# Patient Record
Sex: Female | Born: 1935
Health system: Southern US, Community
[De-identification: ages and names within clinical notes are randomized; demographics above are authoritative.]

## PROBLEM LIST (undated history)

## (undated) DIAGNOSIS — M8580 Other specified disorders of bone density and structure, unspecified site: Secondary | ICD-10-CM

## (undated) DIAGNOSIS — T7840XA Allergy, unspecified, initial encounter: Secondary | ICD-10-CM

## (undated) DIAGNOSIS — C801 Malignant (primary) neoplasm, unspecified: Secondary | ICD-10-CM

## (undated) DIAGNOSIS — H269 Unspecified cataract: Secondary | ICD-10-CM

## (undated) DIAGNOSIS — R42 Dizziness and giddiness: Secondary | ICD-10-CM

## (undated) DIAGNOSIS — E785 Hyperlipidemia, unspecified: Secondary | ICD-10-CM

## (undated) DIAGNOSIS — I10 Essential (primary) hypertension: Secondary | ICD-10-CM

## (undated) DIAGNOSIS — H409 Unspecified glaucoma: Secondary | ICD-10-CM

## (undated) HISTORY — DX: Unspecified cataract: H26.9

## (undated) HISTORY — PX: NM PET DX LYMPHOMA: HXRAD65

## (undated) HISTORY — DX: Allergy, unspecified, initial encounter: T78.40XA

## (undated) HISTORY — DX: Other specified disorders of bone density and structure, unspecified site: M85.80

## (undated) HISTORY — DX: Hyperlipidemia, unspecified: E78.5

## (undated) HISTORY — PX: ACNE CYST REMOVAL: SUR1112

## (undated) HISTORY — DX: Unspecified glaucoma: H40.9

---

## 1977-10-25 HISTORY — PX: ABDOMINAL HYSTERECTOMY: SHX81

## 2002-06-14 ENCOUNTER — Ambulatory Visit (HOSPITAL_COMMUNITY): Admission: RE | Admit: 2002-06-14 | Discharge: 2002-06-14 | Payer: Self-pay | Admitting: Family Medicine

## 2002-06-14 ENCOUNTER — Encounter: Payer: Self-pay | Admitting: Family Medicine

## 2002-08-02 ENCOUNTER — Ambulatory Visit (HOSPITAL_BASED_OUTPATIENT_CLINIC_OR_DEPARTMENT_OTHER): Admission: RE | Admit: 2002-08-02 | Discharge: 2002-08-02 | Payer: Self-pay | Admitting: Surgery

## 2002-08-02 ENCOUNTER — Encounter (INDEPENDENT_AMBULATORY_CARE_PROVIDER_SITE_OTHER): Payer: Self-pay | Admitting: *Deleted

## 2004-10-25 HISTORY — PX: OTHER SURGICAL HISTORY: SHX169

## 2005-07-29 ENCOUNTER — Other Ambulatory Visit: Admission: RE | Admit: 2005-07-29 | Discharge: 2005-07-29 | Payer: Self-pay | Admitting: Family Medicine

## 2007-02-27 ENCOUNTER — Ambulatory Visit (HOSPITAL_COMMUNITY): Admission: RE | Admit: 2007-02-27 | Discharge: 2007-02-27 | Payer: Self-pay | Admitting: Ophthalmology

## 2008-01-26 ENCOUNTER — Ambulatory Visit (HOSPITAL_COMMUNITY): Admission: RE | Admit: 2008-01-26 | Discharge: 2008-01-26 | Payer: Self-pay | Admitting: Family Medicine

## 2008-12-04 ENCOUNTER — Encounter (INDEPENDENT_AMBULATORY_CARE_PROVIDER_SITE_OTHER): Payer: Self-pay | Admitting: *Deleted

## 2009-04-02 ENCOUNTER — Ambulatory Visit: Payer: Self-pay | Admitting: Gastroenterology

## 2009-04-16 ENCOUNTER — Ambulatory Visit: Payer: Self-pay | Admitting: Gastroenterology

## 2009-10-25 DIAGNOSIS — R42 Dizziness and giddiness: Secondary | ICD-10-CM

## 2009-10-25 HISTORY — DX: Dizziness and giddiness: R42

## 2011-03-12 NOTE — Op Note (Signed)
   NAME:  Maria Camacho, Maria Camacho                           ACCOUNT NO.:  [id number]   MEDICAL RECORD NO.:  [id number]                   PATIENT TYPE:  AMB   LOCATION:  [facility]                                  FACILITY:  [facility]   PHYSICIAN:  Abigail Miyamoto, MD                DATE OF BIRTH:  [birth year]   DATE OF PROCEDURE:  08/02/2002  DATE OF DISCHARGE:                                 OPERATIVE REPORT   PREOPERATIVE DIAGNOSES:  2 cm midline neck mass.   POSTOPERATIVE DIAGNOSES:  2 cm midline neck mass.   PROCEDURE:  Excision of midline neck mass.   SURGEON:  Abigail Miyamoto, M.D.   ANESTHESIA:  1% lidocaine and monitored anesthesia care.   ESTIMATED BLOOD LOSS:  Minimal.   FINDINGS:  The patient was found to have a small, prominent area of fatty  tissue just above the sternal notch.  No other pathology was identified in  the neck.   PROCEDURE IN DETAIL:  The patient was brought to the operating room and  identified as [name].  She was placed supine on the operating room  table, and anesthesia was induced. Her neck was then prepped and draped in  the usual sterile fashion.  The skin overlying the palpable abnormality was  then anesthetized with 1% lidocaine.  A small transverse incision was then  made across the lower neck just above the sternal notch.  The incision was  carried down through the platysma via electrocautery.  The patient was found  to have prominent fatty-appearing tissue in this area, and this was grasped  with an Allis clamp and completely excised circumferentially with  electrocautery.  The sternocleidomastoid muscles were then easily  identified, as well the prominent heads of the clavicle.  The trachea also  was examined and was normal, and no other pathology or masses were palpated  in the neck area through this incision.  Hemostasis was then achieved with  the cautery.  The platysma was then reapproximated with interrupted 3-0  Vicryl sutures, and the skin was  closed with running 4-0 Monocryl.  Steri-  Strips were applied.  The patient tolerated the procedure well.  All counts  were correct at the end of the procedure.  The patient was then taken in a  stable condition from the operating room to the recovery room.                                               Abigail Miyamoto, MD    DB/MEDQ  D:  08/02/2002  T:  08/02/2002  Job:  [id number]

## 2012-02-22 DIAGNOSIS — R413 Other amnesia: Secondary | ICD-10-CM | POA: Insufficient documentation

## 2012-08-02 ENCOUNTER — Encounter (HOSPITAL_COMMUNITY): Payer: Self-pay | Admitting: Pharmacy Technician

## 2012-08-05 ENCOUNTER — Other Ambulatory Visit: Payer: Self-pay | Admitting: Specialist

## 2012-08-08 ENCOUNTER — Ambulatory Visit (HOSPITAL_COMMUNITY)
Admission: RE | Admit: 2012-08-08 | Discharge: 2012-08-08 | Disposition: A | Discharge status: Home / Self Care | Payer: Medicare Other | Source: Ambulatory Visit | Attending: Specialist | Admitting: Specialist

## 2012-08-08 ENCOUNTER — Encounter (HOSPITAL_COMMUNITY)
Admission: RE | Admit: 2012-08-08 | Discharge: 2012-08-08 | Disposition: A | Discharge status: Home / Self Care | Payer: Medicare Other | Source: Ambulatory Visit | Attending: Specialist | Admitting: Specialist

## 2012-08-08 ENCOUNTER — Other Ambulatory Visit: Payer: Self-pay

## 2012-08-08 ENCOUNTER — Encounter (HOSPITAL_COMMUNITY): Payer: Self-pay

## 2012-08-08 DIAGNOSIS — Z01810 Encounter for preprocedural cardiovascular examination: Secondary | ICD-10-CM | POA: Insufficient documentation

## 2012-08-08 DIAGNOSIS — Z01818 Encounter for other preprocedural examination: Secondary | ICD-10-CM | POA: Insufficient documentation

## 2012-08-08 DIAGNOSIS — Z01812 Encounter for preprocedural laboratory examination: Secondary | ICD-10-CM | POA: Insufficient documentation

## 2012-08-08 HISTORY — DX: Dizziness and giddiness: R42

## 2012-08-08 HISTORY — DX: Malignant (primary) neoplasm, unspecified: C80.1

## 2012-08-08 HISTORY — DX: Essential (primary) hypertension: I10

## 2012-08-08 NOTE — Patient Instructions (Addendum)
20 Maria Camacho  08/08/2012   Your procedure is scheduled on:  Thursday 08-10-2012  Report to [facility] at  AM. 0930  Call this number if you have problems the morning of surgery: [phone]   Remember:   Do not eat food or drink :After Midnight.      Take these medicines the morning of surgery with A SIP OF WATER: None   Do not wear jewelry, make-up or nail polish.  Do not wear lotions, powders, or perfumes. You may wear deodorant.  Do not shave 48 hours prior to surgery. Men may shave face and neck.  Do not bring valuables to the hospital.  Contacts, dentures or bridgework may not be worn into surgery.  Leave suitcase in the car. After surgery it may be brought to your room.  For patients admitted to the hospital, checkout time is 11:00 AM the day of discharge.   Patients discharged the day of surgery will not be allowed to drive home.  Name and phone number of your driver:   Special Instructions: CHG Shower Use Special Wash: 1/2 bottle night before surgery and 1/2 bottle morning of surgery.   Please read over the following fact sheets that you were given: MRSA Information,deep breath and cough post surgery every other hour

## 2012-08-10 ENCOUNTER — Encounter (HOSPITAL_COMMUNITY): Payer: Self-pay | Admitting: *Deleted

## 2012-08-10 ENCOUNTER — Ambulatory Visit (HOSPITAL_COMMUNITY): Payer: Medicare Other | Admitting: Anesthesiology

## 2012-08-10 ENCOUNTER — Encounter (HOSPITAL_COMMUNITY)
Admission: RE | Disposition: A | Discharge status: Home / Self Care | Payer: Self-pay | Source: Ambulatory Visit | Attending: Specialist

## 2012-08-10 ENCOUNTER — Encounter (HOSPITAL_COMMUNITY): Payer: Self-pay | Admitting: Anesthesiology

## 2012-08-10 ENCOUNTER — Ambulatory Visit (HOSPITAL_COMMUNITY)
Admission: RE | Admit: 2012-08-10 | Discharge: 2012-08-10 | Disposition: A | Discharge status: Home / Self Care | Payer: Medicare Other | Source: Ambulatory Visit | Attending: Specialist | Admitting: Specialist

## 2012-08-10 DIAGNOSIS — I10 Essential (primary) hypertension: Secondary | ICD-10-CM | POA: Insufficient documentation

## 2012-08-10 DIAGNOSIS — M2240 Chondromalacia patellae, unspecified knee: Secondary | ICD-10-CM | POA: Insufficient documentation

## 2012-08-10 DIAGNOSIS — M9420 Chondromalacia, unspecified site: Secondary | ICD-10-CM | POA: Insufficient documentation

## 2012-08-10 DIAGNOSIS — M2340 Loose body in knee, unspecified knee: Secondary | ICD-10-CM | POA: Insufficient documentation

## 2012-08-10 DIAGNOSIS — X58XXXA Exposure to other specified factors, initial encounter: Secondary | ICD-10-CM | POA: Insufficient documentation

## 2012-08-10 DIAGNOSIS — S83206A Unspecified tear of unspecified meniscus, current injury, right knee, initial encounter: Secondary | ICD-10-CM

## 2012-08-10 DIAGNOSIS — IMO0002 Reserved for concepts with insufficient information to code with codable children: Secondary | ICD-10-CM | POA: Insufficient documentation

## 2012-08-10 DIAGNOSIS — Z79899 Other long term (current) drug therapy: Secondary | ICD-10-CM | POA: Insufficient documentation

## 2012-08-10 HISTORY — PX: KNEE ARTHROSCOPY: SHX127

## 2012-08-10 SURGERY — ARTHROSCOPY, KNEE
Anesthesia: General | Site: Knee | Laterality: Left | Wound class: Clean

## 2012-08-10 MED ORDER — BUPIVACAINE-EPINEPHRINE (PF) 0.5% -1:200000 IJ SOLN
INTRAMUSCULAR | Status: AC
  Filled 2012-08-10: qty 10

## 2012-08-10 MED ORDER — CHLORHEXIDINE GLUCONATE 4 % EX LIQD
60.0000 mL | Freq: Once | CUTANEOUS | Status: DC
  Filled 2012-08-10: qty 60

## 2012-08-10 MED ORDER — LACTATED RINGERS IV SOLN: INTRAVENOUS | Status: DC

## 2012-08-10 MED ORDER — CEFAZOLIN SODIUM-DEXTROSE 2-3 GM-% IV SOLR: 2.0000 g | INTRAVENOUS | Status: DC

## 2012-08-10 MED ORDER — ACETAMINOPHEN 10 MG/ML IV SOLN
INTRAVENOUS | Status: AC
  Filled 2012-08-10: qty 100

## 2012-08-10 MED ORDER — HYDROCODONE-ACETAMINOPHEN 5-325 MG PO TABS: 1.0000 | ORAL_TABLET | ORAL | Status: DC | PRN

## 2012-08-10 MED ORDER — EPINEPHRINE HCL 1 MG/ML IJ SOLN
INTRAMUSCULAR | Status: DC | PRN
  Administered 2012-08-10 (×2): 1 mg

## 2012-08-10 MED ORDER — ACETAMINOPHEN 10 MG/ML IV SOLN
INTRAVENOUS | Status: DC | PRN
  Administered 2012-08-10: 1000 mg via INTRAVENOUS

## 2012-08-10 MED ORDER — LACTATED RINGERS IR SOLN
Status: DC | PRN
  Administered 2012-08-10 (×2): 3000 mL

## 2012-08-10 MED ORDER — LACTATED RINGERS IV SOLN
INTRAVENOUS | Status: DC
  Administered 2012-08-10: 1000 mL via INTRAVENOUS

## 2012-08-10 MED ORDER — VANCOMYCIN HCL IN DEXTROSE 1-5 GM/200ML-% IV SOLN
INTRAVENOUS | Status: AC
  Filled 2012-08-10: qty 200

## 2012-08-10 MED ORDER — PROMETHAZINE HCL 25 MG/ML IJ SOLN: 6.2500 mg | INTRAMUSCULAR | Status: DC | PRN

## 2012-08-10 MED ORDER — EPINEPHRINE HCL 1 MG/ML IJ SOLN
INTRAMUSCULAR | Status: AC
  Filled 2012-08-10: qty 2

## 2012-08-10 MED ORDER — PROPOFOL 10 MG/ML IV BOLUS
INTRAVENOUS | Status: DC | PRN
  Administered 2012-08-10: 150 mg via INTRAVENOUS

## 2012-08-10 MED ORDER — MEPERIDINE HCL 50 MG/ML IJ SOLN: 6.2500 mg | INTRAMUSCULAR | Status: DC | PRN

## 2012-08-10 MED ORDER — VANCOMYCIN HCL IN DEXTROSE 1-5 GM/200ML-% IV SOLN
1000.0000 mg | INTRAVENOUS | Status: AC
  Administered 2012-08-10: 1000 mg via INTRAVENOUS

## 2012-08-10 MED ORDER — AMLODIPINE BESYLATE 5 MG PO TABS
5.0000 mg | ORAL_TABLET | ORAL | Status: AC
  Administered 2012-08-10: 5 mg via ORAL
  Filled 2012-08-10: qty 1

## 2012-08-10 MED ORDER — ONDANSETRON HCL 4 MG/2ML IJ SOLN
INTRAMUSCULAR | Status: DC | PRN
  Administered 2012-08-10: 4 mg via INTRAVENOUS

## 2012-08-10 MED ORDER — LIDOCAINE HCL (CARDIAC) 20 MG/ML IV SOLN
INTRAVENOUS | Status: DC | PRN
  Administered 2012-08-10: 50 mg via INTRAVENOUS

## 2012-08-10 MED ORDER — BUPIVACAINE-EPINEPHRINE 0.5% -1:200000 IJ SOLN
INTRAMUSCULAR | Status: DC | PRN
  Administered 2012-08-10: 20 mL

## 2012-08-10 MED ORDER — FENTANYL CITRATE 0.05 MG/ML IJ SOLN
INTRAMUSCULAR | Status: DC | PRN
  Administered 2012-08-10 (×2): 50 ug via INTRAVENOUS

## 2012-08-10 MED ORDER — FENTANYL CITRATE 0.05 MG/ML IJ SOLN: 25.0000 ug | INTRAMUSCULAR | Status: DC | PRN

## 2012-08-10 SURGICAL SUPPLY — 22 items
BANDAGE ELASTIC 6 VELCRO ST LF (GAUZE/BANDAGES/DRESSINGS) ×2 IMPLANT
BLADE 4.2CUDA (BLADE) ×2 IMPLANT
BLADE CUDA SHAVER 3.5 (BLADE) ×2 IMPLANT
CLOTH BEACON ORANGE TIMEOUT ST (SAFETY) ×2 IMPLANT
DRSG EMULSION OIL 3X3 NADH (GAUZE/BANDAGES/DRESSINGS) ×2 IMPLANT
DURAPREP 26ML APPLICATOR (WOUND CARE) ×4 IMPLANT
GLOVE BIOGEL PI IND STRL 6.5 (GLOVE) ×1 IMPLANT
GLOVE BIOGEL PI IND STRL 8 (GLOVE) IMPLANT
GLOVE BIOGEL PI INDICATOR 6.5 (GLOVE) ×1
GLOVE BIOGEL PI INDICATOR 8 (GLOVE)
GLOVE ECLIPSE 6.5 STRL STRAW (GLOVE) ×2 IMPLANT
GLOVE SURG SS PI 8.0 STRL IVOR (GLOVE) ×4 IMPLANT
GOWN PREVENTION PLUS LG XLONG (DISPOSABLE) ×2 IMPLANT
GOWN STRL REIN XL XLG (GOWN DISPOSABLE) ×2 IMPLANT
MANIFOLD NEPTUNE II (INSTRUMENTS) ×2 IMPLANT
PACK ARTHROSCOPY WL (CUSTOM PROCEDURE TRAY) ×2 IMPLANT
PADDING CAST COTTON 6X4 STRL (CAST SUPPLIES) ×2 IMPLANT
SET ARTHROSCOPY TUBING (MISCELLANEOUS) ×1
SET ARTHROSCOPY TUBING LN (MISCELLANEOUS) ×1 IMPLANT
SUT ETHILON 4 0 PS 2 18 (SUTURE) ×2 IMPLANT
TOWEL OR NON WOVEN STRL DISP B (DISPOSABLE) ×2 IMPLANT
WRAP KNEE MAXI GEL POST OP (GAUZE/BANDAGES/DRESSINGS) ×2 IMPLANT

## 2012-08-10 NOTE — Transfer of Care (Signed)
Immediate Anesthesia Transfer of Care Note  Patient: Maria Camacho  Procedure(s) Performed: Procedure(s) (LRB) with comments: ARTHROSCOPY KNEE (Left) - WITH DEBRIDEMENT  Patient Location: PACU  Anesthesia Type: General  Level of Consciousness: awake, alert  and patient cooperative  Airway & Oxygen Therapy: Patient Spontanous Breathing and Patient connected to face mask oxygen  Post-op Assessment: Report given to PACU RN, Post -op Vital signs reviewed and stable and Patient moving all extremities X 4  Post vital signs: Reviewed and stable  Complications: No apparent anesthesia complications

## 2012-08-10 NOTE — H&P (Signed)
Maria Camacho is an 78 y.o. female.   Chief Complaint: left knee pain HPI: Meniscus tear DJD left knee.  Past Medical History  Diagnosis Date  . Hypertension   . Cancer     kidney right  . Vertigo 2011    Past Surgical History  Procedure Date  . Nm pet dx lymphoma   . Acne cyst removal over 20 yrs. ago    Fatty tiisue of neck  . Abdominal hysterectomy 1979    History reviewed. No pertinent family history. Social History:  reports that she has never smoked. She has never used smokeless tobacco. She reports that she does not drink alcohol or use illicit drugs.  Allergies:  Allergies  Allergen Reactions  . Penicillins     REACTION: hives    Medications Prior to Admission  Medication Sig Dispense Refill  . amLODipine-valsartan (EXFORGE) 10-320 MG per tablet Take 0.5 tablets by mouth every morning.      . B Complex-C-E-Zn (BEC/ZINC) TABS Take 1 tablet by mouth daily.      . Cholecalciferol (VITAMIN D) 2000 UNITS tablet Take 2,000 Units by mouth daily.      . fish oil-omega-3 fatty acids 1000 MG capsule Take 1 g by mouth daily.      Marland Kitchen ibuprofen (ADVIL,MOTRIN) 200 MG tablet Take 200 mg by mouth every 6 (six) hours as needed. For pain        No results found for this or any previous visit (from the past 48 hour(s)). No results found.  Review of Systems  Musculoskeletal: Positive for joint pain.  All other systems reviewed and are negative.    Blood pressure 147/78, pulse 62, temperature 97.6 F (36.4 C), resp. rate 20, SpO2 100.00%. Physical Exam  Vitals reviewed. Constitutional: She is oriented to person, place, and time. She appears well-developed.  HENT:  Head: Normocephalic.  Eyes: Pupils are equal, round, and reactive to light.  Neck: Normal range of motion.  Cardiovascular: Normal rate.   Respiratory: Effort normal.  GI: Soft.  Musculoskeletal:       MJLT left knee. +Mcmurray left. +effusion.  Neurological: She is alert and oriented to person, place, and  time.  Skin: Skin is warm and dry.  Psychiatric: She has a normal mood and affect.   MRI DJD meniscus tear.  Assessment/Plan Meniscus tear, DJD left knee. Plan Arthroscopy. Risks discussed.  Remington Highbaugh C 08/10/2012, 12:01 PM

## 2012-08-10 NOTE — Brief Op Note (Signed)
08/10/2012  1:24 PM  PATIENT:  Maria Camacho  78 y.o. female  PRE-OPERATIVE DIAGNOSIS:  MEDIAL MENISCAL TEAR  POST-OPERATIVE DIAGNOSIS:  MEDIAL MENISCAL TEAR  PROCEDURE:  Procedure(s) (LRB) with comments: ARTHROSCOPY KNEE (Left) - WITH DEBRIDEMENT  SURGEON:  Surgeon(s) and Role:    * Javier Docker, MD - Primary  PHYSICIAN ASSISTANT:   ASSISTANTS: none   ANESTHESIA:   general  EBL:     BLOOD ADMINISTERED:none  DRAINS: none   LOCAL MEDICATIONS USED:  MARCAINE     SPECIMEN:  No Specimen  DISPOSITION OF SPECIMEN:  N/A  COUNTS:  YES  TOURNIQUET:  * No tourniquets in log *  DICTATION: .Other Dictation: Dictation Number [id number]  PLAN OF CARE: Discharge to home after PACU  PATIENT DISPOSITION:  PACU - hemodynamically stable.   Delay start of Pharmacological VTE agent (>24hrs) due to surgical blood loss or risk of bleeding: no

## 2012-08-10 NOTE — Anesthesia Preprocedure Evaluation (Signed)
Anesthesia Evaluation  Patient identified by MRN, date of birth, ID band Patient awake    Reviewed: Allergy & Precautions, H&P , NPO status , Patient's Chart, lab work & pertinent test results  Airway Mallampati: II TM Distance: >3 FB Neck ROM: Full    Dental No notable dental hx.    Pulmonary neg pulmonary ROS,  breath sounds clear to auscultation  Pulmonary exam normal       Cardiovascular hypertension, Pt. on medications negative cardio ROS  Rhythm:Regular Rate:Normal     Neuro/Psych negative neurological ROS  negative psych ROS   GI/Hepatic negative GI ROS, Neg liver ROS,   Endo/Other  negative endocrine ROS  Renal/GU negative Renal ROS  negative genitourinary   Musculoskeletal negative musculoskeletal ROS (+)   Abdominal   Peds negative pediatric ROS (+)  Hematology negative hematology ROS (+)   Anesthesia Other Findings   Reproductive/Obstetrics negative OB ROS                           Anesthesia Physical Anesthesia Plan  ASA: II  Anesthesia Plan: General   Post-op Pain Management:    Induction: Intravenous  Airway Management Planned: LMA  Additional Equipment:   Intra-op Plan:   Post-operative Plan: Extubation in OR  Informed Consent: I have reviewed the patients History and Physical, chart, labs and discussed the procedure including the risks, benefits and alternatives for the proposed anesthesia with the patient or authorized representative who has indicated his/her understanding and acceptance.   Dental advisory given  Plan Discussed with: CRNA  Anesthesia Plan Comments:         Anesthesia Quick Evaluation  

## 2012-08-10 NOTE — Progress Notes (Signed)
Pt has walker at home. Ambulated to bathroom with loaner walker s/p left knee arthroscopy and tolerated well.

## 2012-08-10 NOTE — Anesthesia Postprocedure Evaluation (Signed)
  Anesthesia Post-op Note  Patient: Maria Camacho  Procedure(s) Performed: Procedure(s) (LRB): ARTHROSCOPY KNEE (Left)  Patient Location: PACU  Anesthesia Type: General  Level of Consciousness: awake and alert   Airway and Oxygen Therapy: Patient Spontanous Breathing  Post-op Pain: mild  Post-op Assessment: Post-op Vital signs reviewed, Patient's Cardiovascular Status Stable, Respiratory Function Stable, Patent Airway and No signs of Nausea or vomiting  Post-op Vital Signs: stable  Complications: No apparent anesthesia complications

## 2012-08-11 ENCOUNTER — Encounter (HOSPITAL_COMMUNITY): Payer: Self-pay | Admitting: Specialist

## 2012-08-11 NOTE — Op Note (Signed)
Maria Camacho, Maria Camacho                 ACCOUNT NO.:  [id number]  MEDICAL RECORD NO.:  [id number]  LOCATION:  [facility]                         FACILITY:  [facility]  PHYSICIAN:  Jene Every, M.D.    DATE OF BIRTH:  18-Nov-1932  DATE OF PROCEDURE:  08/10/2012 DATE OF DISCHARGE:  08/10/2012                              OPERATIVE REPORT   PREOPERATIVE DIAGNOSIS:  Medial meniscal tear of the left knee.  POSTOPERATIVE DIAGNOSES:  Medial meniscal tear of the left knee, chondromalacia of patellofemoral joint, lateral compartment, medial compartment with loose cartilaginous bodies.  PROCEDURE PERFORMED: 1. Left knee arthroscopy. 2. Partial medial meniscectomy. 3. Removal of loose bodies. 4. Chondroplasty of medial femoral condyle and of the patella.  ANESTHESIA:  General.  ASSISTANT:  None.  HISTORY:  A 78 year old with locking and popping giving way.  MRI indicating displaced meniscal tear posteromedially, posterior third, indicated for partial meniscectomy and debridement.  Risks and benefits were discussed including bleeding, infection, DVT, PE, and anesthetic complications, etc.  TECHNIQUE:  With the patient in supine position after induction of adequate general anesthesia, 1 g of vancomycin, the left lower extremity was prepped and draped in usual sterile fashion.  A lateral parapatellar portal was fashioned with a #11 blade.  Ingress cannula was atraumatically placed.  Irrigant was utilized to insufflate the joint. Under direct visualization, medial parapatellar portal was fashioned with a #11 blade after localization with 18-gauge needle sparing the medial meniscus.  There was extensive tearing of the posterior third of the medial meniscus, posterior half of the medial meniscus.  Straight basket was introduced and resected the meniscus tear to a stable base, it was complex, further contoured with 3.5 Cuda shaver to the stable base approximately 50% and the posterior half was resected.   Extensive grade 3 changes of the femoral condyle.  Light chondroplasty was performed here.  No grade 4 changes were noted.  Loose cartilaginous bodies were retrieved as well as the medial compartment, and removed with pituitary and shaver.  ACL was frayed.  Lateral compartment revealed minor grade 2 changes of femoral condyle, tibial plateau and meniscus stable to probe palpation without evidence of tearing.  Suprapatellar pouch revealed grade 3 changes and extension of patella. Normal patellofemoral tracking.  Light chondroplasty was performed here. Gutters were unremarkable.  Reexamined all compartments.  No further pathology, amenable to arthroscopic intervention.  I, therefore, removed all instrumentation. Portals were closed with 4-0 nylon simple sutures.  A 0.25% Marcaine with epinephrine was infiltrated in the joint.  Wound was dressed sterilely.  Awakened without difficulty and transported to the recovery room in satisfactory condition.  The patient tolerated the procedure well.  No complications.  No assistant.  Minimal blood loss.     Jene Every, M.D.     [name]  D:  08/10/2012  T:  08/11/2012  Job:  [id number]

## 2013-02-06 ENCOUNTER — Ambulatory Visit (INDEPENDENT_AMBULATORY_CARE_PROVIDER_SITE_OTHER): Payer: Medicare Other | Admitting: Obstetrics & Gynecology

## 2013-02-06 ENCOUNTER — Encounter: Payer: Self-pay | Admitting: Obstetrics & Gynecology

## 2013-02-06 VITALS — BP 142/75 | HR 76 | Resp 16 | Ht 59.0 in | Wt 148.0 lb

## 2013-02-06 DIAGNOSIS — Z85528 Personal history of other malignant neoplasm of kidney: Secondary | ICD-10-CM | POA: Insufficient documentation

## 2013-02-06 DIAGNOSIS — K649 Unspecified hemorrhoids: Secondary | ICD-10-CM

## 2013-02-06 DIAGNOSIS — C649 Malignant neoplasm of unspecified kidney, except renal pelvis: Secondary | ICD-10-CM

## 2013-02-06 DIAGNOSIS — Z139 Encounter for screening, unspecified: Secondary | ICD-10-CM

## 2013-02-06 DIAGNOSIS — Z01419 Encounter for gynecological examination (general) (routine) without abnormal findings: Secondary | ICD-10-CM

## 2013-02-06 DIAGNOSIS — N950 Postmenopausal bleeding: Secondary | ICD-10-CM

## 2013-02-06 LAB — POCT URINALYSIS DIPSTICK
Blood, UA: NEGATIVE
Nitrite, UA: NEGATIVE
Urobilinogen, UA: 0.2
pH, UA: 5

## 2013-02-06 NOTE — Patient Instructions (Signed)
Atrophic Vaginitis Atrophic vaginitis is a problem of low levels of estrogen in women. This problem can happen at any age. It is most common in women who have gone through menopause ("the change").  HOW WILL I KNOW IF I HAVE THIS PROBLEM? You may have:  Trouble with peeing (urinating), such as:  Going to the bathroom often.  A hard time holding your pee until you reach a bathroom.  Leaking pee.  Having pain when you pee.  Itching or a burning feeling.  Vaginal bleeding and spotting.  Pain during sex.  Dryness of the vagina.  A yellow, bad-smelling fluid (discharge) coming from the vagina. HOW WILL MY DOCTOR CHECK FOR THIS PROBLEM?  During your exam, your doctor will likely find the problem.  If there is a vaginal fluid, it may be checked for infection. HOW WILL THIS PROBLEM BE TREATED? Keep the vulvar skin as clean as possible. Moisturizers and lubricants can help with some of the symptoms. Estrogen replacement can help. There are 2 ways to take estrogen:  Systemic estrogen gets estrogen to your whole body. It takes many weeks or months before the symptoms get better.  You take an estrogen pill.  You use a skin patch. This is a patch that you put on your skin.  If you still have your uterus, your doctor may ask you to take a hormone. Talk to your doctor about the right medicine for you.  Estrogen cream.  This puts estrogen only at the part of your body where you apply it. The cream is put into the vagina or put on the vulvar skin. For some women, estrogen cream works faster than pills or the patch. CAN ALL WOMEN WITH THIS PROBLEM USE ESTROGEN? No. Women with certain types of cancer, liver problems, or problems with blood clots should not take estrogen. Your doctor can help you decide the best treatment for your symptoms. Document Released: 03/29/2008 Document Revised: 01/03/2012 Document Reviewed: 03/29/2008 [employer] Patient Information 2013 [employer].  

## 2013-02-06 NOTE — Progress Notes (Signed)
  Subjective:    Maria Camacho is a 79 y.o. female who presents for an annual exam. The patient had vaginal spotting which stopped after she resume vaginal estrogen prescribed by Dr. Christell Constant. GYN screening history: no hx abnormal pap.. The patient wears seatbelts: yes. The patient participates in regular exercise: yes. Has the patient ever been transfused or tattooed?: not asked. The patient reports that there is not domestic violence in her life.   Menstrual History: OB History   Grav Para Term Preterm Abortions TAB SAB Ect Mult Living                 [id number] Menarche age: normal  No LMP recorded. Patient has had a hysterectomy.    The following portions of the patient's history were reviewed and updated as appropriate: allergies, current medications, past family history, past medical history, past social history, past surgical history and problem list.  Review of Systems Gastrointestinal: positive for hemorrhoid, not bleeding Genitourinary:positive for no discharge or bleeding now.  Integument/breast: negative    Objective:    BP 142/75  Pulse 76  Resp 16  Ht 4\' 11"  (1.499 m)  Wt 148 lb (67.132 kg)  BMI 29.88 kg/m2  General Appearance:    Alert, cooperative, no distress, appears stated age  Head:    Normocephalic, without obvious abnormality, atraumatic  Eyes:    Ears:    Nose:     Throat:   Lips, mucosa, and tongue normal; teeth and gums normal  Neck:    Back:     Symmetric, no curvature, ROM normal, no CVA tenderness  Lungs:     Clear to auscultation bilaterally, respirations unlabored  Chest Wall:    No tenderness or deformity   Heart:    Regular rate and rhythm  Breast Exam:    No tenderness, masses, or nipple abnormality  Abdomen:     Soft, non-tender, bowel sounds active all four quadrants,    no masses, no organomegaly  Genitalia:    Normal female without lesion, discharge or tenderness.Mild atrophy, no lesion, no mass  Rectal:    External hemorrhoid  Extremities:    Extremities normal, atraumatic, no cyanosis or edema  Pulses:   Skin:   Skin color, texture, turgor normal, no rashes or lesions  Lymph nodes:   axillary nodes normal  Neurologic:   CNII-XII intact, normal strength  .    Assessment:    Healthy female exam. Possible sx vaginal atrophy, no lesion   Plan:    . Paps not indicated for this patient.  F/U Dr. Christell Constant and her oncologist  Adam Phenix, MD 02/06/2013

## 2013-03-02 ENCOUNTER — Other Ambulatory Visit (INDEPENDENT_AMBULATORY_CARE_PROVIDER_SITE_OTHER): Payer: Medicare PPO

## 2013-03-02 DIAGNOSIS — R5381 Other malaise: Secondary | ICD-10-CM

## 2013-03-02 DIAGNOSIS — R350 Frequency of micturition: Secondary | ICD-10-CM

## 2013-03-02 DIAGNOSIS — E785 Hyperlipidemia, unspecified: Secondary | ICD-10-CM

## 2013-03-02 DIAGNOSIS — I10 Essential (primary) hypertension: Secondary | ICD-10-CM

## 2013-03-02 DIAGNOSIS — E559 Vitamin D deficiency, unspecified: Secondary | ICD-10-CM

## 2013-03-02 LAB — POCT CBC
HCT, POC: 47.3 % (ref 37.7–47.9)
Hemoglobin: 15.6 g/dL (ref 12.2–16.2)
MCH, POC: 29.7 pg (ref 27–31.2)
MCV: 90.1 fL (ref 80–97)
RBC: 5.3 M/uL (ref 4.04–5.48)

## 2013-03-02 LAB — HEPATIC FUNCTION PANEL
AST: 19 U/L (ref 0–37)
Albumin: 4.5 g/dL (ref 3.5–5.2)
Alkaline Phosphatase: 59 U/L (ref 39–117)
Total Bilirubin: 0.5 mg/dL (ref 0.3–1.2)

## 2013-03-02 LAB — POCT UA - MICROSCOPIC ONLY
Mucus, UA: NEGATIVE
Yeast, UA: NEGATIVE

## 2013-03-02 LAB — BASIC METABOLIC PANEL WITH GFR
BUN: 11 mg/dL (ref 6–23)
CO2: 24 mEq/L (ref 19–32)
Chloride: 107 mEq/L (ref 96–112)
Creat: 0.89 mg/dL (ref 0.50–1.10)
Glucose, Bld: 89 mg/dL (ref 70–99)

## 2013-03-02 LAB — POCT URINALYSIS DIPSTICK
Bilirubin, UA: NEGATIVE
Ketones, UA: NEGATIVE
Spec Grav, UA: 1.01
pH, UA: 7

## 2013-03-02 NOTE — Progress Notes (Signed)
Patient came in for labs only.

## 2013-03-06 LAB — NMR LIPOPROFILE WITH LIPIDS
HDL Size: 9.7 nm (ref >=9.2)
Large HDL-P: 12.1 umol/L (ref >=4.8)
Large VLDL-P: 2.2 nmol/L (ref <=2.7)
Triglycerides: 132 mg/dL (ref <150)

## 2013-03-14 ENCOUNTER — Encounter: Payer: Self-pay | Admitting: Family Medicine

## 2013-03-14 ENCOUNTER — Ambulatory Visit (INDEPENDENT_AMBULATORY_CARE_PROVIDER_SITE_OTHER): Payer: Medicare PPO | Admitting: Family Medicine

## 2013-03-14 ENCOUNTER — Telehealth: Payer: Self-pay | Admitting: *Deleted

## 2013-03-14 VITALS — BP 148/72 | HR 68 | Temp 98.4°F | Ht 58.5 in | Wt 148.2 lb

## 2013-03-14 DIAGNOSIS — E785 Hyperlipidemia, unspecified: Secondary | ICD-10-CM | POA: Insufficient documentation

## 2013-03-14 DIAGNOSIS — I10 Essential (primary) hypertension: Secondary | ICD-10-CM | POA: Insufficient documentation

## 2013-03-14 DIAGNOSIS — C641 Malignant neoplasm of right kidney, except renal pelvis: Secondary | ICD-10-CM

## 2013-03-14 DIAGNOSIS — C649 Malignant neoplasm of unspecified kidney, except renal pelvis: Secondary | ICD-10-CM

## 2013-03-14 MED ORDER — PITAVASTATIN CALCIUM 4 MG PO TABS: 2.0000 mg | ORAL_TABLET | ORAL | Status: DC

## 2013-03-14 NOTE — Progress Notes (Signed)
  Subjective:    Patient ID: Maria Camacho, female    DOB: 1932-12-19, 79 y.o.   MRN: [id number]  HPI Maria Camacho returns today for followup of her chronic medical problem. Patient saw Dr. Debroah Loop for her pelvic exam on April 15. She will be seeing Dr. Shelby Dubin in July.   Review of Systems  HENT: Positive for postnasal drip (slight due to allergies).   Eyes: Positive for redness (R lower lid).  Respiratory: Negative.   Cardiovascular: Negative.   Gastrointestinal: Negative.   Genitourinary: Positive for frequency (at night) and hematuria.  Musculoskeletal: Negative.   Skin: Negative.   Allergic/Immunologic: Positive for environmental allergies (slight, seasonal).  Neurological: Negative.   Hematological: Negative.   Psychiatric/Behavioral: Negative.    Recent labs reviewed with patient and she has a copy of those.    Objective:   Physical Exam BP 148/72  Pulse 68  Temp(Src) 98.4 F (36.9 C) (Oral)  Ht 4' 10.5" (1.486 m)  Wt 148 lb 3.2 oz (67.223 kg)  BMI 30.44 kg/m2 Home blood pressure readings that were reviewed were better. See scanned reading.  The patient appeared well nourished and normally developed, alert and oriented to time and place. Speech, behavior and judgement appear normal. Vital signs as documented.  Head exam is unremarkable. No scleral icterus or pallor noted. Minimal nasal congestion. Throat normal. Neck is without jugular venous distension, thyromegally, or carotid bruits. Carotid upstrokes are brisk bilaterally. No cervical adenopathy. Lungs are clear anteriorly and posteriorly to auscultation. Normal respiratory effort. There is no axillary adenopathy Cardiac exam reveals regular rate and rhythm at 72 per minute. First and second heart sounds normal.  No murmurs, rubs or gallops.  Abdominal exam reveals normal bowl sounds, no masses, no organomegaly and no aortic enlargement. No inguinal adenopathy. Extremities are nonedematous and both femoral and pedal  pulses are normal. Skin without pallor or jaundice.  Warm and dry, without rash. She has small sebaceous cyst below the right eye and above her right eyelid. Neurologic exam reveals normal deep tendon reflexes and normal sensation.          Assessment & Plan:  1. Renal carcinoma, right Followup with Dr. Margo Aye the summer as planned  2. Hyperlipidemia Try new cholesterol agent as directed  3. Hypertension Continue current medications and monitor blood pressures at home   Patient Instructions  Fall precautions discussed  Continue current meds and therapeutic lifestyle changes Try Livalo 4mg  one half tablet Tuesday and Saturday If any problems just discontinue the meds

## 2013-03-14 NOTE — Telephone Encounter (Signed)
Message copied by Bearl Mulberry on Wed Mar 14, 2013  5:57 PM ------      Message from: Ernestina Penna      Created: Tue Mar 06, 2013  7:20 PM       Electrolytes, blood sugar, and renal function are all within normal limits.      Liver function tests within normal limits.      With advanced lipid testing the total LDL particle number is elevated at 1416 , the LDL C. is elevated at 124, triglycerides slightly elevated at 132      Vitamin D is good at 48      We really need to be more aggressive with cholesterol control----have her see a clinical pharmacist and see if they can get her started on a low-dose statin that is not expensive. Also discuss therapeutic lifestyle changes ------

## 2013-03-14 NOTE — Telephone Encounter (Signed)
Pt notified of results

## 2013-03-14 NOTE — Patient Instructions (Addendum)
Fall precautions discussed  Continue current meds and therapeutic lifestyle changes Try Livalo 4mg  one half tablet Tuesday and Saturday If any problems just discontinue the meds

## 2013-04-10 ENCOUNTER — Other Ambulatory Visit: Payer: Self-pay | Admitting: Family Medicine

## 2013-05-24 ENCOUNTER — Ambulatory Visit (INDEPENDENT_AMBULATORY_CARE_PROVIDER_SITE_OTHER): Payer: Medicare PPO | Admitting: Physician Assistant

## 2013-05-24 ENCOUNTER — Encounter: Payer: Self-pay | Admitting: Physician Assistant

## 2013-05-24 VITALS — BP 128/71 | HR 76 | Temp 97.6°F | Ht 58.5 in | Wt 146.0 lb

## 2013-05-24 DIAGNOSIS — K645 Perianal venous thrombosis: Secondary | ICD-10-CM

## 2013-05-24 NOTE — Patient Instructions (Signed)
Continue to use current medications of Hydrocortisone 2.5%. Suggested using cold packs up to 4 times daily for 20 min each. Alternating with warm sitz baths. Suggested using OTC Tucks wipes after bowel movement- blotting, not wiping. Referral to General surgeon

## 2013-05-24 NOTE — Progress Notes (Signed)
  Subjective:    Patient ID: Maria Camacho, female    DOB: [birth year], 79 y.o.   MRN: [id number]  HPI39 y/o female presents for intermittent hemorrhoids x 8 years that are worsening. States that she has tried OTC Nupercainal, cold packs, Hydrocortisone 2.5% with some relief. Worse with defecation and sitting for long periods of time.      Review of Systems Negative for sweats, chills, fever. Endorses mild bleeding with defecation. Denies constipation or diarrhea, or change in bowel habits.      Objective:   Physical Exam PE reveals external hemorrhoids that appear to be thrombosed. Surrounding erythema.         Assessment & Plan:  1. External Hemorrhoids: Instructed patient to continue to use current medications of Hydrocortisone 2.5% and OTC Nupercainal. Suggested using cold packs up to 4 times daily for 20 min each. Alternating with warm sitz baths. Suggested using OTC Tucks wipes after bowel movement- blotting, not wiping. Referral to General surgeon for further treatment.

## 2013-07-26 ENCOUNTER — Encounter: Payer: Self-pay | Admitting: Family Medicine

## 2013-07-26 ENCOUNTER — Ambulatory Visit (INDEPENDENT_AMBULATORY_CARE_PROVIDER_SITE_OTHER): Payer: Medicare PPO | Admitting: Family Medicine

## 2013-07-26 VITALS — BP 137/81 | HR 70 | Temp 98.6°F | Ht <= 58 in | Wt 148.0 lb

## 2013-07-26 DIAGNOSIS — I10 Essential (primary) hypertension: Secondary | ICD-10-CM

## 2013-07-26 DIAGNOSIS — K649 Unspecified hemorrhoids: Secondary | ICD-10-CM

## 2013-07-26 DIAGNOSIS — M1710 Unilateral primary osteoarthritis, unspecified knee: Secondary | ICD-10-CM

## 2013-07-26 DIAGNOSIS — M1712 Unilateral primary osteoarthritis, left knee: Secondary | ICD-10-CM

## 2013-07-26 DIAGNOSIS — E785 Hyperlipidemia, unspecified: Secondary | ICD-10-CM

## 2013-07-26 DIAGNOSIS — Z23 Encounter for immunization: Secondary | ICD-10-CM

## 2013-07-26 DIAGNOSIS — Z780 Asymptomatic menopausal state: Secondary | ICD-10-CM

## 2013-07-26 DIAGNOSIS — E559 Vitamin D deficiency, unspecified: Secondary | ICD-10-CM

## 2013-07-26 NOTE — Patient Instructions (Signed)
You will receive your flu shot today Always be careful and to not put yourself at risk for falling Continue current medication Continue aggressive therapeutic lifestyle changes which include diet and exercise Drink plenty of fluids Take Advil one twice daily after eating for 7-10 days to see if this will help her left knee

## 2013-07-26 NOTE — Addendum Note (Signed)
Addended by: Magdalene River on: 07/26/2013 10:52 AM   Modules accepted: Orders

## 2013-07-26 NOTE — Progress Notes (Signed)
Subjective:    Patient ID: Maria Camacho, female    DOB: 08-26-1933, 79 y.o.   MRN: [id number]  HPI Pt here for follow up and management of chronic medical problems. Patient is in today for her routine followup and does have some complaints with her knee. She sees [facility] for this and has been doing some things at home which have made it feel better. She did followup this summer with the urologist on her renal cell carcinoma and he will see her yearly for at least a couple of more years. She will need a DEXA scan in January of 2015.    Patient Active Problem List   Diagnosis Date Noted  . hyperlipidemia 03/14/2013  . Hypertension 03/14/2013  . Renal carcinoma 02/06/2013  . Routine gynecological examination 02/06/2013  . Postmenopausal vaginal bleeding 02/06/2013  . Hemorrhoid 02/06/2013   Outpatient Encounter Prescriptions as of 07/26/2013  Medication Sig Dispense Refill  . B Complex-C-E-Zn (BEC/ZINC) TABS Take 1 tablet by mouth daily.      . Cholecalciferol (VITAMIN D) 2000 UNITS tablet Take 2,000 Units by mouth daily.      Marland Kitchen conjugated estrogens (PREMARIN) vaginal cream Place 1 g vaginally daily.      Marland Kitchen EXFORGE 10-320 MG per tablet TAKE (1) TABLET DAILY AS DIRECTED.  30 tablet  4  . fish oil-omega-3 fatty acids 1000 MG capsule Take 1 g by mouth daily.      . hydrocortisone (PROCTOZONE-HC) 2.5 % rectal cream Place 1 application rectally 2 (two) times daily.      Bertram Gala Glycol-Propyl Glycol (SYSTANE) 0.4-0.3 % SOLN Apply 1 drop to eye.       No facility-administered encounter medications on file as of 07/26/2013.    Review of Systems  Constitutional: Negative.   HENT: Negative.   Eyes: Negative.   Respiratory: Negative.   Cardiovascular: Negative.   Gastrointestinal: Negative.   Endocrine: Negative.   Genitourinary: Negative.   Musculoskeletal: Positive for joint swelling (left knee pain and swelling down left leg).  Skin: Negative.   Allergic/Immunologic:  Negative.   Neurological: Negative.   Hematological: Negative.   Psychiatric/Behavioral: Negative.        Objective:   Physical Exam  Nursing note and vitals reviewed. Constitutional: She is oriented to person, place, and time. She appears well-developed and well-nourished.  HENT:  Head: Normocephalic and atraumatic.  Right Ear: External ear normal.  Left Ear: External ear normal.  Nose: Nose normal.  Mouth/Throat: Oropharynx is clear and moist.  Eyes: Conjunctivae and EOM are normal. Pupils are equal, round, and reactive to light. Right eye exhibits no discharge. Left eye exhibits no discharge. No scleral icterus.  Neck: Normal range of motion. Neck supple. No thyromegaly present.  Cardiovascular: Normal rate, regular rhythm, normal heart sounds and intact distal pulses.  Exam reveals no gallop and no friction rub.   No murmur heard. At 72 per minute  Pulmonary/Chest: Effort normal and breath sounds normal. No respiratory distress. She has no wheezes. She has no rales. She exhibits no tenderness.  No axillary adenopathy  Abdominal: Soft. Bowel sounds are normal. She exhibits no mass. There is no tenderness. There is no rebound and no guarding.  Musculoskeletal: Normal range of motion. She exhibits no edema and no tenderness.  Lymphadenopathy:    She has no cervical adenopathy.  Neurological: She is alert and oriented to person, place, and time. She has normal reflexes. No cranial nerve deficit.  Skin: Skin is warm and dry.  Psychiatric: She has a normal mood and affect. Her behavior is normal. Judgment and thought content normal.   BP 137/81  Pulse 70  Temp(Src) 98.6 F (37 C) (Oral)  Ht 4\' 10"  (1.473 m)  Wt 148 lb (67.132 kg)  BMI 30.94 kg/m2        Assessment & Plan:   1. Hemorrhoid, improved   2. hyperlipidemia   3. Hypertension   4. Vitamin D deficiency   5. Osteoarthritis of left knee    Orders Placed This Encounter  Procedures  . Hepatic function panel     Standing Status: Future     Number of Occurrences:      Standing Expiration Date: 07/26/2014  . BMP8+EGFR    Standing Status: Future     Number of Occurrences:      Standing Expiration Date: 07/26/2014  . NMR, lipoprofile    Standing Status: Future     Number of Occurrences:      Standing Expiration Date: 07/26/2014  . Vit D  25 hydroxy (rtn osteoporosis monitoring)    Standing Status: Future     Number of Occurrences:      Standing Expiration Date: 07/26/2014  . POCT CBC    Standing Status: Future     Number of Occurrences:      Standing Expiration Date: 07/26/2014   No orders of the defined types were placed in this encounter.    Patient Instructions  You will receive your flu shot today Always be careful and to not put yourself at risk for falling Continue current medication Continue aggressive therapeutic lifestyle changes which include diet and exercise Drink plenty of fluids Take Advil one twice daily after eating for 7-10 days to see if this will help her left knee   Nyra Capes MD

## 2013-08-08 ENCOUNTER — Ambulatory Visit: Payer: Medicare PPO

## 2013-08-14 ENCOUNTER — Other Ambulatory Visit (INDEPENDENT_AMBULATORY_CARE_PROVIDER_SITE_OTHER): Payer: Medicare PPO

## 2013-08-14 DIAGNOSIS — E785 Hyperlipidemia, unspecified: Secondary | ICD-10-CM

## 2013-08-14 DIAGNOSIS — I10 Essential (primary) hypertension: Secondary | ICD-10-CM

## 2013-08-14 DIAGNOSIS — E559 Vitamin D deficiency, unspecified: Secondary | ICD-10-CM

## 2013-08-14 DIAGNOSIS — K649 Unspecified hemorrhoids: Secondary | ICD-10-CM

## 2013-08-14 LAB — POCT CBC
Granulocyte percent: 52.6 %G (ref 37–80)
Lymph, poc: 3.2 (ref 0.6–3.4)
MCH, POC: 29.8 pg (ref 27–31.2)
MCHC: 32.7 g/dL (ref 31.8–35.4)
MCV: 91.3 fL (ref 80–97)
MPV: 9 fL (ref 0–99.8)
POC LYMPH PERCENT: 42.6 %L (ref 10–50)
Platelet Count, POC: 221 10*3/uL (ref 142–424)
WBC: 7.5 10*3/uL (ref 4.6–10.2)

## 2013-08-16 LAB — VITAMIN D 25 HYDROXY (VIT D DEFICIENCY, FRACTURES): Vit D, 25-Hydroxy: 38.2 ng/mL (ref 30.0–100.0)

## 2013-08-16 LAB — NMR, LIPOPROFILE
HDL Cholesterol by NMR: 76 mg/dL (ref >=40)
HDL Particle Number: 44.4 umol/L (ref >=30.5)
LDL Particle Number: 1430 nmol/L — ABNORMAL HIGH (ref <1000)
LDL Size: 21.2 nm (ref >20.5)
Small LDL Particle Number: 124 nmol/L (ref <=527)
Triglycerides by NMR: 121 mg/dL (ref <150)

## 2013-08-16 LAB — BMP8+EGFR
BUN/Creatinine Ratio: 14 (ref 11–26)
CO2: 26 mmol/L (ref 18–29)
Calcium: 9.9 mg/dL (ref 8.6–10.2)
Creatinine, Ser: 0.87 mg/dL (ref 0.57–1.00)
GFR calc Af Amer: 74 mL/min/{1.73_m2} (ref >59)
GFR calc non Af Amer: 64 mL/min/{1.73_m2} (ref >59)
Glucose: 90 mg/dL (ref 65–99)
Potassium: 5.6 mmol/L — ABNORMAL HIGH (ref 3.5–5.2)
Sodium: 144 mmol/L (ref 134–144)

## 2013-08-16 LAB — HEPATIC FUNCTION PANEL
ALT: 19 IU/L (ref 0–32)
AST: 24 IU/L (ref 0–40)
Alkaline Phosphatase: 61 IU/L (ref 39–117)
Total Bilirubin: 0.4 mg/dL (ref 0.0–1.2)
Total Protein: 7.1 g/dL (ref 6.0–8.5)

## 2013-08-24 ENCOUNTER — Telehealth: Payer: Self-pay | Admitting: Family Medicine

## 2013-08-29 NOTE — Telephone Encounter (Signed)
Copies of labs up front and LMTCB for pt

## 2013-09-24 DIAGNOSIS — H409 Unspecified glaucoma: Secondary | ICD-10-CM

## 2013-09-24 HISTORY — DX: Unspecified glaucoma: H40.9

## 2013-10-10 ENCOUNTER — Encounter (INDEPENDENT_AMBULATORY_CARE_PROVIDER_SITE_OTHER): Payer: Self-pay

## 2013-10-10 ENCOUNTER — Ambulatory Visit (INDEPENDENT_AMBULATORY_CARE_PROVIDER_SITE_OTHER): Payer: Medicare PPO | Admitting: Pharmacist

## 2013-10-10 ENCOUNTER — Ambulatory Visit (INDEPENDENT_AMBULATORY_CARE_PROVIDER_SITE_OTHER): Payer: Medicare PPO

## 2013-10-10 VITALS — BP 136/72 | HR 74 | Ht <= 58 in | Wt 147.0 lb

## 2013-10-10 DIAGNOSIS — Z0000 Encounter for general adult medical examination without abnormal findings: Secondary | ICD-10-CM

## 2013-10-10 DIAGNOSIS — E559 Vitamin D deficiency, unspecified: Secondary | ICD-10-CM

## 2013-10-10 DIAGNOSIS — M1710 Unilateral primary osteoarthritis, unspecified knee: Secondary | ICD-10-CM

## 2013-10-10 DIAGNOSIS — Z780 Asymptomatic menopausal state: Secondary | ICD-10-CM

## 2013-10-10 DIAGNOSIS — M8580 Other specified disorders of bone density and structure, unspecified site: Secondary | ICD-10-CM | POA: Insufficient documentation

## 2013-10-10 NOTE — Patient Instructions (Signed)
Preventive Care for Adults, Female A healthy lifestyle and preventive care can promote health and wellness. Preventive health guidelines for women include the following key practices.  A routine yearly physical is a good way to check with your caregiver about your health and preventive screening. It is a chance to share any concerns and updates on your health, and to receive a thorough exam.  Visit your dentist for a routine exam and preventive care every 6 months. Brush your teeth twice a day and floss once a day. Good oral hygiene prevents tooth decay and gum disease.  The frequency of eye exams is based on your age, health, family medical history, use of contact lenses, and other factors. Follow your caregiver's recommendations for frequency of eye exams.  Eat a healthy diet. Foods like vegetables, fruits, whole grains, low-fat dairy products, and lean protein foods contain the nutrients you need without too many calories. Decrease your intake of foods high in solid fats, added sugars, and salt. Eat the right amount of calories for you.Get information about a proper diet from your caregiver, if necessary.  Regular physical exercise is one of the most important things you can do for your health. Most adults should get at least 150 minutes of moderate-intensity exercise (any activity that increases your heart rate and causes you to sweat) each week. In addition, most adults need muscle-strengthening exercises on 2 or more days a week.  Maintain a healthy weight. The body mass index (BMI) is a screening tool to identify possible weight problems. It provides an estimate of body fat based on height and weight. Your caregiver can help determine your BMI, and can help you achieve or maintain a healthy weight.For adults 20 years and older:  A BMI below 18.5 is considered underweight.  A BMI of 18.5 to 24.9 is normal.  A BMI of 25 to 29.9 is considered overweight.  A BMI of 30 and above is  considered obese.  High blood pressure causes heart disease and increases the risk of stroke. Your blood pressure should be checked at least every 1 to 2 years. Ongoing high blood pressure should be treated with medicines if weight loss and exercise are not effective.  Diabetes screening involves taking a blood sample to check your fasting blood sugar level. This should be done once every 3 years, after age 39, if you are within normal weight and without risk factors for diabetes. Testing should be considered at a younger age or be carried out more frequently if you are overweight and have at least 1 risk factor for diabetes.  Zoster vaccine. One dose is recommended for adults aged 51 years or older unless certain conditions are present.  Measles, mumps, and rubella (MMR) vaccine. Adults born before [birth year] generally are considered immune to measles and mumps. Adults born in [birth year] or later should have 1 or more doses of MMR vaccine unless there is a contraindication to the vaccine or there is laboratory evidence of immunity to each of the three diseases. A routine second dose of MMR vaccine should be obtained at least 28 days after the first dose for students attending postsecondary schools, health care workers, or international travelers. People who received inactivated measles vaccine or an unknown type of measles vaccine during 1963 1967 should receive 2 doses of MMR vaccine. People who received inactivated mumps vaccine or an unknown type of mumps vaccine before 1979 and are at high risk for mumps infection should consider immunization with 2 doses of MMR  vaccine. For females of childbearing age, rubella immunity should be determined. If there is no evidence of immunity, females who are not pregnant should be vaccinated. If there is no evidence of immunity, females who are pregnant should delay immunization until after pregnancy. Unvaccinated health care workers born before [birth year] who lack laboratory evidence  of measles, mumps, or rubella immunity or laboratory confirmation of disease should consider measles and mumps immunization with 2 doses of MMR vaccine or rubella immunization with 1 dose of MMR vaccine.  Pneumococcal 13-valent conjugate (PCV13) vaccine. When indicated, a person who is uncertain of her immunization history and has no record of immunization should receive the PCV13 vaccine. An adult aged 41 years or older who has certain medical conditions and has not been previously immunized should receive 1 dose of PCV13 vaccine. This PCV13 should be followed with a dose of pneumococcal polysaccharide (PPSV23) vaccine. The PPSV23 vaccine dose should be obtained at least 8 weeks after the dose of PCV13 vaccine. An adult aged 5 years or older who has certain medical conditions and previously received 1 or more doses of PPSV23 vaccine should receive 1 dose of PCV13. The PCV13 vaccine dose should be obtained 1 or more years after the last PPSV23 vaccine dose.  Pneumococcal polysaccharide (PPSV23) vaccine. When PCV13 is also indicated, PCV13 should be obtained first. All adults aged 26 years and older should be immunized. An adult younger than age 97 years who has certain medical conditions should be immunized. Any person who resides in a nursing home or long-term care facility should be immunized. An adult smoker should be immunized. People with an immunocompromised condition and certain other conditions should receive both PCV13 and PPSV23 vaccines. People with human immunodeficiency virus (HIV) infection should be immunized as soon as possible after diagnosis. Immunization during chemotherapy or radiation therapy should be avoided. Routine use of PPSV23 vaccine is not recommended for American Indians, [address], or people younger than 65 years unless there are medical conditions that require PPSV23 vaccine. When indicated, people who have unknown immunization and have no record of immunization should  receive PPSV23 vaccine. One-time revaccination 5 years after the first dose of PPSV23 is recommended for people aged 33 64 years who have chronic kidney failure, nephrotic syndrome, asplenia, or immunocompromised conditions. People who received 1 2 doses of PPSV23 before age 21 years should receive another dose of PPSV23 vaccine at age 9 years or later if at least 5 years have passed since the previous dose. Doses of PPSV23 are not needed for people immunized with PPSV23 at or after age 66 years.  Meningococcal vaccine. Adults with asplenia or persistent complement component deficiencies should receive 2 doses of quadrivalent meningococcal conjugate (MenACWY-D) vaccine. The doses should be obtained at least 2 months apart. Microbiologists working with certain meningococcal bacteria, military recruits, people at risk during an outbreak, and people who travel to or live in countries with a high rate of meningitis should be immunized. A first-year college student up through age 52 years who is living in a residence hall should receive a dose if she did not receive a dose on or after her 16th birthday. Adults who have certain high-risk conditions should receive one or more doses of vaccine.  Hepatitis A vaccine. Adults who wish to be protected from this disease, have certain high-risk conditions, work with hepatitis A-infected animals, work in hepatitis A research labs, or travel to or work in countries with a high rate of hepatitis A should be immunized.  Adults who were previously unvaccinated and who anticipate close contact with an international adoptee during the first 60 days after arrival in the [place] from a country with a high rate of hepatitis A should be immunized.  Hepatitis B vaccine. Adults who wish to be protected from this disease, have certain high-risk conditions, may be exposed to blood or other infectious body fluids, are household contacts or sex partners of hepatitis B positive people,  are clients or workers in certain care facilities, or travel to or work in countries with a high rate of hepatitis B should be immunized.  Haemophilus influenzae type b (Hib) vaccine. A previously unvaccinated person with asplenia or sickle cell disease or having a scheduled splenectomy should receive 1 dose of Hib vaccine. Regardless of previous immunization, a recipient of a hematopoietic stem cell transplant should receive a 3-dose series 6 12 months after her successful transplant. Hib vaccine is not recommended for adults with HIV infection   Ages 68 and over  Blood pressure check.  Lipid and cholesterol check. Up to date  Lung cancer screening. / Every year if you are aged 60 80 years and have a 30-pack-year history of smoking and currently smoke or have quit within the past 15 years. Yearly screening is stopped exam. Up to date  Mammogram. Up to date  Clinical breast exam - up to date Every year beginning at age 41 and continuing for as long as you are in good health. Consult with your caregiver.  Pap test. - up to date / Every 3 years starting at age 9 through age 68 or 62 with a 3 consecutive normal Pap tests. Testing can be stopped between 65 and 70 with 3 consecutive normal Pap tests and no abnormal Pap or HPV tests in the past 10 years.  Fecal occult blood test (FOBT) of stool. / Every year beginning at age 42 and continuing until age 62. You may not need to do this test if you get a colonoscopy every 10 years. Last was 10/2012 - up to date  Flexible sigmoidoscopy or colonoscopy. Up to date - next due 2022 / Every 5 years for a flexible sigmoidoscopy or every 10 years for a colonoscopy beginning at age 76 and continuing until age 72.  Osteoporosis screening. Done today - next due 09/2015 /   Skin self-exam. / Monthly.  Influenza vaccine. / Every year.- up to date   Tetanus, diphtheria, and acellular pertussis (Tdap/Td) vaccine. / 1 dose of Td every 10 years. - up to  date  Zoster vaccine. - completed / up to date / 1 dose for adults aged 72 years or older.  Pneumococcal polysaccharide (PPSV23) vaccine. Completed - up to date/ 1 dose for all adults aged 47 years and older.  \   Fall Prevention and Home Safety Falls cause injuries and can affect all age groups. It is possible to use preventive measures to significantly decrease the likelihood of falls. There are many simple measures which can make your home safer and prevent falls. OUTDOORS  Repair cracks and edges of walkways and driveways.  Remove high doorway thresholds.  Trim shrubbery on the main path into your home.  Have good outside lighting.  Clear walkways of tools, rocks, debris, and clutter.  Check that handrails are not broken and are securely fastened. Both sides of steps should have handrails.  Have leaves, snow, and ice cleared regularly.  Use sand or salt on walkways during winter months.  In the garage,  clean up grease or oil spills. BATHROOM  Install night lights.  Install grab bars by the toilet and in the tub and shower.  Use non-skid mats or decals in the tub or shower.  Place a plastic non-slip stool in the shower to sit on, if needed.  Keep floors dry and clean up all water on the floor immediately.  Remove soap buildup in the tub or shower on a regular basis.  Secure bath mats with non-slip, double-sided rug tape.  Remove throw rugs and tripping hazards from the floors. BEDROOMS  Install night lights.  Make sure a bedside light is easy to reach.  Do not use oversized bedding.  Keep a telephone by your bedside.  Have a firm chair with side arms to use for getting dressed.  Remove throw rugs and tripping hazards from the floor. KITCHEN  Keep handles on pots and pans turned toward the center of the stove. Use back burners when possible.  Clean up spills quickly and allow time for drying.  Avoid walking on wet floors.  Avoid hot utensils and  knives.  Position shelves so they are not too high or low.  Place commonly used objects within easy reach.  If necessary, use a sturdy step stool with a grab bar when reaching.  Keep electrical cables out of the way.  Do not use floor polish or wax that makes floors slippery. If you must use wax, use non-skid floor wax.  Remove throw rugs and tripping hazards from the floor. STAIRWAYS  Never leave objects on stairs.  Place handrails on both sides of stairways and use them. Fix any loose handrails. Make sure handrails on both sides of the stairways are as long as the stairs.  Check carpeting to make sure it is firmly attached along stairs. Make repairs to worn or loose carpet promptly.  Avoid placing throw rugs at the top or bottom of stairways, or properly secure the rug with carpet tape to prevent slippage. Get rid of throw rugs, if possible.  Have an electrician put in a light switch at the top and bottom of the stairs. OTHER FALL PREVENTION TIPS  Wear low-heel or rubber-soled shoes that are supportive and fit well. Wear closed toe shoes.  When using a stepladder, make sure it is fully opened and both spreaders are firmly locked. Do not climb a closed stepladder.  Add color or contrast paint or tape to grab bars and handrails in your home. Place contrasting color strips on first and last steps.  Learn and use mobility aids as needed. Install an electrical emergency response system.  Turn on lights to avoid dark areas. Replace light bulbs that burn out immediately. Get light switches that glow.  Arrange furniture to create clear pathways. Keep furniture in the same place.  Firmly attach carpet with non-skid or double-sided tape.  Eliminate uneven floor surfaces.  Select a carpet pattern that does not visually hide the edge of steps.  Be aware of all pets. OTHER HOME SAFETY TIPS  Set the water temperature for 120 F (48.8 C).  Keep emergency numbers on or near the  telephone.  Keep smoke detectors on every level of the home and near sleeping areas. Document Released: 10/01/2002 Document Revised: 04/11/2012 Document Reviewed: 12/31/2011 [facility] Patient Information 2014 [place], [place].

## 2013-10-10 NOTE — Progress Notes (Signed)
Patient ID: Maria Camacho, female   DOB: November 12, 1932, 79 y.o.   MRN: [id number] Subjective:    Maria Camacho is a 79 y.o. female who presents for Medicare Initial preventive examination and review DEXA results  Preventive Screening-Counseling & Management  Tobacco History  Smoking status  . Never Smoker   Smokeless tobacco  . Never Used      Current Problems (verified) Patient Active Problem List   Diagnosis Date Noted  . Osteopenia 10/10/2013  . hyperlipidemia 03/14/2013  . Hypertension 03/14/2013  . Renal carcinoma 02/06/2013  . Routine gynecological examination 02/06/2013  . Postmenopausal vaginal bleeding 02/06/2013  . Hemorrhoid 02/06/2013    Current Medications (verified) Current Outpatient Prescriptions  Medication Sig Dispense Refill  . B Complex-C-E-Zn (BEC/ZINC) TABS Take 1 tablet by mouth daily.      . Cholecalciferol (VITAMIN D) 2000 UNITS tablet Take 2,000 Units by mouth daily.      Marland Kitchen conjugated estrogens (PREMARIN) vaginal cream Place 1 g vaginally daily.      Marland Kitchen EXFORGE 10-320 MG per tablet TAKE (1) TABLET DAILY AS DIRECTED.  30 tablet  4  . fish oil-omega-3 fatty acids 1000 MG capsule Take 1 g by mouth daily.      . hydrocortisone (PROCTOZONE-HC) 2.5 % rectal cream Place 1 application rectally 2 (two) times daily.      Bertram Gala Glycol-Propyl Glycol (SYSTANE) 0.4-0.3 % SOLN Apply 1 drop to eye.       No current facility-administered medications for this visit.     Allergies (verified) Livalo; Penicillins; Actonel; Bacitracin; and Oxytrol   PAST HISTORY  Family History  Family History  Problem Relation Age of Onset  . Diabetes Mother   . Heart disease Mother     stent and stroke  . COPD Mother   . Macular degeneration Mother   . Cancer Father     stomach  . Cancer Sister 53    ?uterine  . Cancer Brother   . Cancer Brother   . Heart disease Brother     Social History History  Substance Use Topics  . Smoking status: Never Smoker   .  Smokeless tobacco: Never Used  . Alcohol Use: No     Are there smokers in your home (other than you)? No  Risk Factors Current exercise habits: Gym/ health club routine includes low impact aerobics and zumba at [facility].  Exercises 2 or 3 days per week Dietary issues discussed:  none  Cardiac risk factors: advanced age (older than 37 for men, 66 for women), dyslipidemia, family history of premature cardiovascular disease and hypertension.  Depression Screen (Note: if answer to either of the following is "Yes", a more complete depression screening is indicated)   Over the past 2 weeks, have you felt down, depressed or hopeless? No  Over the past 2 weeks, have you felt little interest or pleasure in doing things? No  Have you lost interest or pleasure in daily life? No  Do you often feel hopeless? No  Do you cry easily over simple problems? No  Activities of Daily Living In your present state of health, do you have any difficulty performing the following activities?:  Driving? No Managing money?  No Feeding yourself? No Getting from bed to chair? No  Climbing a flight of stairs? No Preparing food and eating?: No Bathing or showering? No Getting dressed: No Getting to the toilet? No Using the toilet:No Moving around from place to place: No In the  past year have you fallen or had a near fall?:No   Are you sexually active?  No  Do you have more than one partner?  No  Hearing Difficulties: No Do you often ask people to speak up or repeat themselves? No Do you experience ringing or noises in your ears? No Do you have difficulty understanding soft or whispered voices? No   Do you feel that you have a problem with memory? No  Do you often misplace items? No  Do you feel safe at home?  Yes  Cognitive Testing  Alert? Yes  Normal Appearance?Yes  Oriented to person? Yes  Place? Yes   Time? Yes  Recall of three objects?  Yes  Can perform simple calculations? Yes  Displays  appropriate judgment?Yes  Can read the correct time from a watch face?Yes   Advanced Directives have been discussed with the patient? Yes  List the Names of Other Physician/Practitioners you currently use: 1.  Dr. Marcha Solders - Urologist (treated renal cell carcinoma) - [place]- [id number] 2.  Dr Daphine Deutscher - opthomologist - [facility] ([facility]) - [place], [place] - [phone] 3.  Dr. Scheryl Darter - GYN   Immunization History  Administered Date(s) Administered  . Influenza,inj,Quad PF,36+ Mos 07/26/2013    Screening Tests Health Maintenance  Topic Date Due  . Mammogram  12/18/2013  . Influenza Vaccine  05/25/2014  . Colonoscopy  03/26/2019  . Tetanus/tdap  02/22/2021  . Pneumococcal Polysaccharide Vaccine Age 20 And Over  Completed  . Zostavax  Completed   DEXA - done today - next due 09/2015 FOBT last 10/2012   All answers were reviewed with the patient and necessary referrals were made:  Henrene Pastor, [facility]   10/10/2013   History reviewed: allergies, current medications, past family history, past medical history, past social history, past surgical history and problem list   Patient with history of ostopenia.  She has experienced fractured pelvis and foot.  In past she has taken Actonel but experienced dizziness.  She has refused treatment for osteopenia in the past.                                                           Last Vitamin D Result:  38.2 (08/14/2013) Last GFR Result:  64 (08/14/2013)      DEXA Results Date of Test T-Score for AP Spine L1-L4 T-Score for Total Left Hip T-Score for Total Right Hip  10/10/13 0.4 -1.3 -1.0  10/06/2011 -0.2 -0.8 -0.8  03/27/2008 -0.7 -1.1 -0.8  10/11/2005 -0.8 -0.8 -0.7     Objective:         Body mass index is 30.73 kg/(m^2). BP 136/72  Pulse 74  Ht 4\' 10"  (1.473 m)  Wt 147 lb (66.679 kg)  BMI 30.73 kg/m2       Assessment:     Annual Wellness Visit - no deficiencies in  Preventative services  noted     Plan:     During the course of the visit the patient was educated and counseled about appropriate screening and preventive services including:    Pneumococcal vaccine   Influenza vaccine  Td vaccine  Screening mammography  Screening Pap smear and pelvic exam   Bone densitometry screening  Colorectal cancer screening  Diabetes screening  Glaucoma screening  Nutrition counseling  continue calcium 1200mg  daily through supplementation or diet.    continue weight bearing exercise great job.  - 30 minutes at least 4 days per week.    Counseled and educated about fall risk and prevention.  Follow low fat diet / increase non starchy vegetable intake and fresh fruits.    Patient Instructions (the written plan) was given to the patient.  Medicare Attestation I have personally reviewed: The patient's medical and social history Their use of alcohol, tobacco or illicit drugs Their current medications and supplements The patient's functional ability including ADLs,fall risks, home safety risks, cognitive, and hearing and visual impairment Diet and physical activities Evidence for depression or mood disorders  The patient's weight, height, BMI, and BP have been recorded in the chart.  I have made referrals, counseling, and provided education to the patient based on review of the above and I have provided the patient with a written personalized care plan for preventive services.     Henrene Pastor, [facility]   10/10/2013

## 2013-12-26 ENCOUNTER — Encounter: Payer: Self-pay | Admitting: Family Medicine

## 2013-12-26 ENCOUNTER — Ambulatory Visit (INDEPENDENT_AMBULATORY_CARE_PROVIDER_SITE_OTHER): Payer: Medicare PPO | Admitting: Family Medicine

## 2013-12-26 VITALS — BP 142/73 | HR 60 | Ht <= 58 in | Wt 135.0 lb

## 2013-12-26 DIAGNOSIS — E559 Vitamin D deficiency, unspecified: Secondary | ICD-10-CM

## 2013-12-26 DIAGNOSIS — M899 Disorder of bone, unspecified: Secondary | ICD-10-CM

## 2013-12-26 DIAGNOSIS — Z23 Encounter for immunization: Secondary | ICD-10-CM

## 2013-12-26 DIAGNOSIS — E785 Hyperlipidemia, unspecified: Secondary | ICD-10-CM

## 2013-12-26 DIAGNOSIS — I10 Essential (primary) hypertension: Secondary | ICD-10-CM

## 2013-12-26 DIAGNOSIS — M949 Disorder of cartilage, unspecified: Secondary | ICD-10-CM

## 2013-12-26 DIAGNOSIS — M8580 Other specified disorders of bone density and structure, unspecified site: Secondary | ICD-10-CM

## 2013-12-26 LAB — POCT CBC
Granulocyte percent: 45.2 %G (ref 37–80)
HCT, POC: 47.5 % (ref 37.7–47.9)
Hemoglobin: 15.3 g/dL (ref 12.2–16.2)
Lymph, poc: 3.5 — AB (ref 0.6–3.4)
MCH, POC: 29.6 pg (ref 27–31.2)
MCHC: 32.1 g/dL (ref 31.8–35.4)
MCV: 92 fL (ref 80–97)
MPV: 9.4 fL (ref 0–99.8)
POC GRANULOCYTE: 3.3 (ref 2–6.9)
POC LYMPH %: 48.2 % (ref 10–50)
Platelet Count, POC: 190 10*3/uL (ref 142–424)
RBC: 5.2 M/uL (ref 4.04–5.48)
RDW, POC: 13.3 %
WBC: 7.2 10*3/uL (ref 4.6–10.2)

## 2013-12-26 NOTE — Addendum Note (Signed)
Addended by: Zannie Cove on: 12/26/2013 12:36 PM   Modules accepted: Orders

## 2013-12-26 NOTE — Patient Instructions (Addendum)
Medicare Annual Wellness Visit  [facility] and the medical providers at [facility] strive to bring you the best medical care.  In doing so we not only want to address your current medical conditions and concerns but also to detect new conditions early and prevent illness, disease and health-related problems.    Medicare offers a yearly Wellness Visit which allows our clinical staff to assess your need for preventative services including immunizations, lifestyle education, counseling to decrease risk of preventable diseases and screening for fall risk and other medical concerns.    This visit is provided free of charge (no copay) for all Medicare recipients. The clinical pharmacists at [facility] have begun to conduct these Wellness Visits which will also include a thorough review of all your medications.    As you primary medical provider recommend that you make an appointment for your Annual Wellness Visit if you have not done so already this year.  You may set up this appointment before you leave today or you may call back ([phone]) and schedule an appointment.  Please make sure when you call that you mention that you are scheduling your Annual Wellness Visit with the clinical pharmacist so that the appointment may be made for the proper length of time.     Continue current medications. Continue good therapeutic lifestyle changes which include good diet and exercise. Fall precautions discussed with patient. If an FOBT was given today- please return it to our front desk. If you are over 53 years old - you may need Prevnar 23 or the adult Pneumonia vaccine.  Continue with diet efforts and sodium restriction monitor blood pressures regularly Return the FOBT

## 2013-12-26 NOTE — Progress Notes (Signed)
Subjective:    Patient ID: Maria Camacho, female    DOB: 1933-02-03, 80 y.o.   MRN: [id number]  HPI Pt here for follow up and management of chronic medical problems. The patient has been on this diet and she has lost 10 pounds and she has been on it for couple months she is feeling good about herself. She is to receive a Prevnar vaccine today, lab work today and will be given FOBT to return.       Patient Active Problem List   Diagnosis Date Noted  . Osteopenia 10/10/2013  . hyperlipidemia 03/14/2013  . Hypertension 03/14/2013  . Renal carcinoma 02/06/2013  . Routine gynecological examination 02/06/2013  . Postmenopausal vaginal bleeding 02/06/2013  . Hemorrhoid 02/06/2013   Outpatient Encounter Prescriptions as of 12/26/2013  Medication Sig  . B Complex-C-E-Zn (BEC/ZINC) TABS Take 1 tablet by mouth daily.  . Cholecalciferol (VITAMIN D) 2000 UNITS tablet Take 2,000 Units by mouth daily.  Marland Kitchen conjugated estrogens (PREMARIN) vaginal cream Place 1 g vaginally daily.  Marland Kitchen EXFORGE 10-320 MG per tablet TAKE (1) TABLET DAILY AS DIRECTED.  . fish oil-omega-3 fatty acids 1000 MG capsule Take 1 g by mouth daily.  . hydrocortisone (PROCTOZONE-HC) 2.5 % rectal cream Place 1 application rectally 2 (two) times daily.  Vladimir Faster Glycol-Propyl Glycol (SYSTANE) 0.4-0.3 % SOLN Apply 1 drop to eye.    Review of Systems  Constitutional: Negative.        On a "fast Metabolism Diet" (book)  HENT: Negative.   Eyes: Negative.   Respiratory: Negative.   Cardiovascular: Negative.   Gastrointestinal: Negative.   Endocrine: Negative.   Genitourinary: Negative.   Musculoskeletal: Negative.   Skin: Negative.   Allergic/Immunologic: Negative.   Neurological: Negative.   Hematological: Negative.   Psychiatric/Behavioral: Negative.        Objective:   Physical Exam  Nursing note and vitals reviewed. Constitutional: She is oriented to person, place, and time. She appears well-developed and  well-nourished. No distress.  HENT:  Head: Normocephalic and atraumatic.  Right Ear: External ear normal.  Left Ear: External ear normal.  Nose: Nose normal.  Mouth/Throat: Oropharynx is clear and moist.  Eyes: Conjunctivae and EOM are normal. Pupils are equal, round, and reactive to light. Right eye exhibits no discharge. Left eye exhibits no discharge. No scleral icterus.  Neck: Normal range of motion. Neck supple. No thyromegaly present.  No carotid bruit  Cardiovascular: Normal rate, regular rhythm, normal heart sounds and intact distal pulses.  Exam reveals no gallop and no friction rub.   No murmur heard. At 72 per minute  Pulmonary/Chest: Effort normal and breath sounds normal. No respiratory distress. She has no wheezes. She has no rales. She exhibits no tenderness.  Abdominal: Soft. Bowel sounds are normal. She exhibits no mass. There is no tenderness. There is no rebound and no guarding.  Musculoskeletal: Normal range of motion. She exhibits no edema and no tenderness.  Lymphadenopathy:    She has no cervical adenopathy.  Neurological: She is alert and oriented to person, place, and time. She has normal reflexes. No cranial nerve deficit.  Skin: Skin is warm and dry.  Psychiatric: She has a normal mood and affect. Her behavior is normal. Judgment and thought content normal.   BP 142/73  Pulse 60  Ht 4' 10"  (1.473 m)  Wt 135 lb (61.236 kg)  BMI 28.22 kg/m2        Assessment & Plan:   1. hyperlipidemia - POCT  CBC - NMR, lipoprofile  2. Hypertension - POCT CBC - BMP8+EGFR - Hepatic function panel  3. Osteopenia - POCT CBC  4. Vitamin D deficiency - Vit D  25 hydroxy (rtn osteoporosis monitoring)  Patient Instructions                       Medicare Annual Wellness Visit  [facility] and the medical providers at [facility] strive to bring you the best medical care.  In doing so we not only want to address your current medical conditions  and concerns but also to detect new conditions early and prevent illness, disease and health-related problems.    Medicare offers a yearly Wellness Visit which allows our clinical staff to assess your need for preventative services including immunizations, lifestyle education, counseling to decrease risk of preventable diseases and screening for fall risk and other medical concerns.    This visit is provided free of charge (no copay) for all Medicare recipients. The clinical pharmacists at [facility] have begun to conduct these Wellness Visits which will also include a thorough review of all your medications.    As you primary medical provider recommend that you make an appointment for your Annual Wellness Visit if you have not done so already this year.  You may set up this appointment before you leave today or you may call back ([phone]) and schedule an appointment.  Please make sure when you call that you mention that you are scheduling your Annual Wellness Visit with the clinical pharmacist so that the appointment may be made for the proper length of time.     Continue current medications. Continue good therapeutic lifestyle changes which include good diet and exercise. Fall precautions discussed with patient. If an FOBT was given today- please return it to our front desk. If you are over 58 years old - you may need Prevnar 67 or the adult Pneumonia vaccine.  Continue with diet efforts and sodium restriction monitor blood pressures regularly Return the FOBT     Arrie Senate MD

## 2013-12-28 ENCOUNTER — Telehealth: Payer: Self-pay | Admitting: Family Medicine

## 2013-12-28 LAB — HEPATIC FUNCTION PANEL
ALT: 17 IU/L (ref 0–32)
AST: 19 IU/L (ref 0–40)
Albumin: 4.8 g/dL (ref 3.5–4.8)
Alkaline Phosphatase: 62 IU/L (ref 39–117)
Bilirubin, Direct: 0.13 mg/dL (ref 0.00–0.40)
TOTAL PROTEIN: 7.2 g/dL (ref 6.0–8.5)
Total Bilirubin: 0.5 mg/dL (ref 0.0–1.2)

## 2013-12-28 LAB — NMR, LIPOPROFILE
Cholesterol: 202 mg/dL — ABNORMAL HIGH (ref <200)
HDL Cholesterol by NMR: 77 mg/dL (ref >=40)
HDL Particle Number: 36.8 umol/L (ref >=30.5)
LDL PARTICLE NUMBER: 996 nmol/L (ref <1000)
LDL Size: 21.4 nm (ref >20.5)
LDLC SERPL CALC-MCNC: 104 mg/dL — AB (ref <100)
Small LDL Particle Number: 303 nmol/L (ref <=527)
TRIGLYCERIDES BY NMR: 103 mg/dL (ref <150)

## 2013-12-28 LAB — BMP8+EGFR
BUN/Creatinine Ratio: 14 (ref 11–26)
BUN: 14 mg/dL (ref 8–27)
CALCIUM: 10.2 mg/dL (ref 8.7–10.3)
CHLORIDE: 104 mmol/L (ref 97–108)
CO2: 19 mmol/L (ref 18–29)
Creatinine, Ser: 1.02 mg/dL — ABNORMAL HIGH (ref 0.57–1.00)
GFR calc Af Amer: 61 mL/min/{1.73_m2} (ref >59)
GFR calc non Af Amer: 53 mL/min/{1.73_m2} — ABNORMAL LOW (ref >59)
GLUCOSE: 97 mg/dL (ref 65–99)
POTASSIUM: 5.3 mmol/L — AB (ref 3.5–5.2)
Sodium: 144 mmol/L (ref 134–144)

## 2013-12-28 LAB — VITAMIN D 25 HYDROXY (VIT D DEFICIENCY, FRACTURES): Vit D, 25-Hydroxy: 42.2 ng/mL (ref 30.0–100.0)

## 2013-12-28 NOTE — Telephone Encounter (Signed)
Message copied by Waverly Ferrari on Fri Dec 28, 2013 11:02 AM ------      Message from: Chipper Herb      Created: Fri Dec 28, 2013 10:59 AM       The blood sugar is good at 97. The creatinine, and imported kidney function tests, is very slightly elevated at 1.02.----- avoid NSAIDs, and we will continue to monitor this in the future. He should probably have another BMP in 3 months. You do not have to be fasting for this. Potassium was slightly elevated but this is okay . The remainder of the electrolytes were within normal limits.      Liver function tests were normal.      : Advanced lipid testing the total LDL particle number was much improved at 996. This is at goal of less than 1000. The previous LDL particle number was 1430. The triglycerides were good.--- Continue your hyper metabolic diet and aggressive therapeutic lifestyle changes      The vitamin D level was good at 33.2, continue current ------

## 2014-01-07 ENCOUNTER — Other Ambulatory Visit: Payer: Medicare PPO

## 2014-01-07 DIAGNOSIS — Z1212 Encounter for screening for malignant neoplasm of rectum: Secondary | ICD-10-CM

## 2014-01-07 NOTE — Progress Notes (Signed)
Patient came dropped off fobt

## 2014-01-09 LAB — FECAL OCCULT BLOOD, IMMUNOCHEMICAL: Fecal Occult Bld: NEGATIVE

## 2014-01-21 ENCOUNTER — Encounter: Payer: Self-pay | Admitting: *Deleted

## 2014-01-21 NOTE — Progress Notes (Signed)
Quick Note:  Copy of labs sent to patient ______ 

## 2014-02-13 ENCOUNTER — Other Ambulatory Visit: Payer: Self-pay | Admitting: Family Medicine

## 2014-05-15 DIAGNOSIS — N3281 Overactive bladder: Secondary | ICD-10-CM | POA: Insufficient documentation

## 2014-05-15 DIAGNOSIS — IMO0002 Reserved for concepts with insufficient information to code with codable children: Secondary | ICD-10-CM | POA: Insufficient documentation

## 2014-05-16 ENCOUNTER — Ambulatory Visit (INDEPENDENT_AMBULATORY_CARE_PROVIDER_SITE_OTHER): Payer: Medicare PPO | Admitting: Family Medicine

## 2014-05-16 ENCOUNTER — Encounter: Payer: Self-pay | Admitting: Family Medicine

## 2014-05-16 VITALS — BP 140/66 | HR 58 | Temp 97.0°F | Ht <= 58 in | Wt 135.0 lb

## 2014-05-16 DIAGNOSIS — E785 Hyperlipidemia, unspecified: Secondary | ICD-10-CM

## 2014-05-16 DIAGNOSIS — N8111 Cystocele, midline: Secondary | ICD-10-CM

## 2014-05-16 DIAGNOSIS — I10 Essential (primary) hypertension: Secondary | ICD-10-CM

## 2014-05-16 DIAGNOSIS — T148 Other injury of unspecified body region: Secondary | ICD-10-CM

## 2014-05-16 DIAGNOSIS — M949 Disorder of cartilage, unspecified: Secondary | ICD-10-CM

## 2014-05-16 DIAGNOSIS — M899 Disorder of bone, unspecified: Secondary | ICD-10-CM

## 2014-05-16 DIAGNOSIS — M8580 Other specified disorders of bone density and structure, unspecified site: Secondary | ICD-10-CM

## 2014-05-16 DIAGNOSIS — N8110 Cystocele, unspecified: Secondary | ICD-10-CM

## 2014-05-16 DIAGNOSIS — Z85528 Personal history of other malignant neoplasm of kidney: Secondary | ICD-10-CM

## 2014-05-16 DIAGNOSIS — E559 Vitamin D deficiency, unspecified: Secondary | ICD-10-CM

## 2014-05-16 DIAGNOSIS — W57XXXA Bitten or stung by nonvenomous insect and other nonvenomous arthropods, initial encounter: Secondary | ICD-10-CM

## 2014-05-16 LAB — POCT CBC
Granulocyte percent: 57.2 %G (ref 37–80)
HCT, POC: 43.5 % (ref 37.7–47.9)
Hemoglobin: 14 g/dL (ref 12.2–16.2)
LYMPH, POC: 3.4 (ref 0.6–3.4)
MCH: 30 pg (ref 27–31.2)
MCHC: 32.3 g/dL (ref 31.8–35.4)
MCV: 92.9 fL (ref 80–97)
MPV: 9.2 fL (ref 0–99.8)
POC Granulocyte: 4.8 (ref 2–6.9)
POC LYMPH PERCENT: 40.2 %L (ref 10–50)
Platelet Count, POC: 229 10*3/uL (ref 142–424)
RBC: 4.7 M/uL (ref 4.04–5.48)
RDW, POC: 13 %
WBC: 8.4 10*3/uL (ref 4.6–10.2)

## 2014-05-16 NOTE — Progress Notes (Signed)
Subjective:    Patient ID: Maria Camacho, female    DOB: April 09, 1933, 80 y.o.   MRN: [id number]  HPI Pt here for follow up and management of chronic medical problems. Please see the review of systems has some other complaints this patient had. She does have a remote history of renal cell carcinoma. She was seen by the urologist and was told that she had a cystocele for which he is going to be following. She hit her head on a car door and had a contusion above the left orbit and this has subsequently resolved with no specific symptoms other than the bruising. She had a tick bite in June and this was not only her but for short period of time. She had some right shoulder and rib pain she's not sure how she developed this, as there was no history of any injury. She does not think this is a major problem.       Patient Active Problem List   Diagnosis Date Noted  . Osteopenia 10/10/2013  . hyperlipidemia 03/14/2013  . Hypertension 03/14/2013  . Renal carcinoma 02/06/2013  . Routine gynecological examination 02/06/2013  . Postmenopausal vaginal bleeding 02/06/2013  . Hemorrhoid 02/06/2013   Outpatient Encounter Prescriptions as of 05/16/2014  Medication Sig  . amLODipine-valsartan (EXFORGE) 10-320 MG per tablet TAKE (1) TABLET DAILY AS DIRECTED.  . B Complex-C-E-Zn (BEC/ZINC) TABS Take 1 tablet by mouth daily.  . Cholecalciferol (VITAMIN D) 2000 UNITS tablet Take 2,000 Units by mouth daily.  Marland Kitchen conjugated estrogens (PREMARIN) vaginal cream Place 1 g vaginally daily.  . fish oil-omega-3 fatty acids 1000 MG capsule Take 1 g by mouth daily.  Vladimir Faster Glycol-Propyl Glycol (SYSTANE) 0.4-0.3 % SOLN Apply 1 drop to eye.  . [DISCONTINUED] amLODipine-valsartan (EXFORGE) 10-160 MG per tablet Take 1 tablet by mouth daily.  . [DISCONTINUED] hydrocortisone (PROCTOZONE-HC) 2.5 % rectal cream Place 1 application rectally 2 (two) times daily.    Review of Systems  Constitutional: Positive for fatigue.    HENT: Negative.   Eyes: Negative.   Respiratory: Negative.   Cardiovascular: Negative.   Gastrointestinal: Negative.   Endocrine: Negative.   Genitourinary: Negative.        Bladder dropping- followed by Dr Nevada Crane   Musculoskeletal: Positive for arthralgias (pain in right shoulder and right rib area).  Skin: Negative.   Allergic/Immunologic: Negative.   Neurological: Negative.        Hit head on car door 04/28/14- caused bruising, swelling, and blood in eye = did not see physician   Hematological: Negative.   Psychiatric/Behavioral: Negative.        Objective:   Physical Exam  Nursing note and vitals reviewed. Constitutional: She is oriented to person, place, and time. She appears well-developed and well-nourished. No distress.  Pleasant cooperative and alert  HENT:  Head: Normocephalic and atraumatic.  Right Ear: External ear normal.  Left Ear: External ear normal.  Mouth/Throat: Oropharynx is clear and moist.  Nasal congestion bilaterally  Eyes: Conjunctivae and EOM are normal. Pupils are equal, round, and reactive to light. Right eye exhibits no discharge. Left eye exhibits no discharge. No scleral icterus.  Neck: Normal range of motion. Neck supple. No thyromegaly present.  Cardiovascular: Normal rate, regular rhythm, normal heart sounds and intact distal pulses.  Exam reveals no gallop and no friction rub.   No murmur heard. At 60 per minute  Pulmonary/Chest: Effort normal and breath sounds normal. No respiratory distress. She has no wheezes. She has no  rales. She exhibits no tenderness.  No chest rash or tenderness  Abdominal: Soft. Bowel sounds are normal. She exhibits no mass. There is no tenderness. There is no rebound and no guarding.  Musculoskeletal: Normal range of motion. She exhibits no edema and no tenderness.  Lymphadenopathy:    She has no cervical adenopathy.  Neurological: She is alert and oriented to person, place, and time. She has normal reflexes. No  cranial nerve deficit.  Skin: Skin is warm and dry. No rash noted. No erythema. No pallor.  Psychiatric: She has a normal mood and affect. Her behavior is normal. Judgment and thought content normal.    BP 140/66  Pulse 58  Temp(Src) 97 F (36.1 C) (Oral)  Ht 4' 10"  (1.473 m)  Wt 135 lb (61.236 kg)  BMI 28.22 kg/m2       Assessment & Plan:   1. hyperlipidemia - POCT CBC - NMR, lipoprofile  2. Essential hypertension - POCT CBC - BMP8+EGFR - Hepatic function panel  3. Osteopenia - POCT CBC - Vit D  25 hydroxy (rtn osteoporosis monitoring)  4. Vitamin D deficiency - Vit D  25 hydroxy (rtn osteoporosis monitoring)  5. Tick bite - [place] m[place] spotted fvr abs pnl(IgG+IgM) - Lyme Ab/Western Blot Reflex  6. Bladder prolapse, female, acquired  7. History of renal cell carcinoma  Meds ordered this encounter  Medications  . DISCONTD: amLODipine-valsartan (EXFORGE) 10-160 MG per tablet    Sig: Take 1 tablet by mouth daily.   Patient Instructions                       Medicare Annual Wellness Visit  [facility] and the medical providers at [facility] strive to bring you the best medical care.  In doing so we not only want to address your current medical conditions and concerns but also to detect new conditions early and prevent illness, disease and health-related problems.    Medicare offers a yearly Wellness Visit which allows our clinical staff to assess your need for preventative services including immunizations, lifestyle education, counseling to decrease risk of preventable diseases and screening for fall risk and other medical concerns.    This visit is provided free of charge (no copay) for all Medicare recipients. The clinical pharmacists at [facility] have begun to conduct these Wellness Visits which will also include a thorough review of all your medications.    As you primary medical provider recommend that you make an  appointment for your Annual Wellness Visit if you have not done so already this year.  You may set up this appointment before you leave today or you may call back ([phone]) and schedule an appointment.  Please make sure when you call that you mention that you are scheduling your Annual Wellness Visit with the clinical pharmacist so that the appointment may be made for the proper length of time.     Continue current medications. Continue good therapeutic lifestyle changes which include good diet and exercise. Fall precautions discussed with patient. If an FOBT was given today- please return it to our front desk. If you are over 41 years old - you may need Prevnar 19 or the adult Pneumonia vaccine.  The right side pain does not get better in the next 3-4 weeks, please return to the clinic for a chest x-ray. Notify us in the future for any other tick bites or tick removals. Followup with the urologist regarding her  prolapsed bladder    Arrie Senate MD

## 2014-05-16 NOTE — Patient Instructions (Addendum)
Medicare Annual Wellness Visit  [facility] and the medical providers at [facility] strive to bring you the best medical care.  In doing so we not only want to address your current medical conditions and concerns but also to detect new conditions early and prevent illness, disease and health-related problems.    Medicare offers a yearly Wellness Visit which allows our clinical staff to assess your need for preventative services including immunizations, lifestyle education, counseling to decrease risk of preventable diseases and screening for fall risk and other medical concerns.    This visit is provided free of charge (no copay) for all Medicare recipients. The clinical pharmacists at [facility] have begun to conduct these Wellness Visits which will also include a thorough review of all your medications.    As you primary medical provider recommend that you make an appointment for your Annual Wellness Visit if you have not done so already this year.  You may set up this appointment before you leave today or you may call back ([phone]) and schedule an appointment.  Please make sure when you call that you mention that you are scheduling your Annual Wellness Visit with the clinical pharmacist so that the appointment may be made for the proper length of time.     Continue current medications. Continue good therapeutic lifestyle changes which include good diet and exercise. Fall precautions discussed with patient. If an FOBT was given today- please return it to our front desk. If you are over 22 years old - you may need Prevnar 27 or the adult Pneumonia vaccine.  The right side pain does not get better in the next 3-4 weeks, please return to the clinic for a chest x-ray. Notify us in the future for any other tick bites or tick removals. Followup with the urologist regarding her prolapsed bladder

## 2014-05-18 LAB — NMR, LIPOPROFILE
Cholesterol: 225 mg/dL — ABNORMAL HIGH (ref 100–199)
HDL Cholesterol by NMR: 85 mg/dL (ref >39)
HDL PARTICLE NUMBER: 41.5 umol/L (ref >=30.5)
LDL Particle Number: 1140 nmol/L — ABNORMAL HIGH (ref <1000)
LDL SIZE: 21.5 nm (ref >20.5)
LDLC SERPL CALC-MCNC: 117 mg/dL — ABNORMAL HIGH (ref 0–99)
LP-IR Score: 26 (ref <=45)
SMALL LDL PARTICLE NUMBER: 173 nmol/L (ref <=527)
Triglycerides by NMR: 116 mg/dL (ref 0–149)

## 2014-05-18 LAB — HEPATIC FUNCTION PANEL
ALBUMIN: 4.4 g/dL (ref 3.5–4.8)
ALK PHOS: 64 IU/L (ref 39–117)
ALT: 18 IU/L (ref 0–32)
AST: 21 IU/L (ref 0–40)
BILIRUBIN TOTAL: 0.5 mg/dL (ref 0.0–1.2)
Bilirubin, Direct: 0.11 mg/dL (ref 0.00–0.40)
Total Protein: 7.1 g/dL (ref 6.0–8.5)

## 2014-05-18 LAB — LYME AB/WESTERN BLOT REFLEX
LYME DISEASE AB, QUANT, IGM: <0.8 index (ref 0.00–0.79)
Lyme IgG/IgM Ab: <0.91 {ISR} (ref 0.00–0.90)

## 2014-05-18 LAB — BMP8+EGFR
BUN / CREAT RATIO: 15 (ref 11–26)
BUN: 13 mg/dL (ref 8–27)
CO2: 23 mmol/L (ref 18–29)
CREATININE: 0.87 mg/dL (ref 0.57–1.00)
Calcium: 10 mg/dL (ref 8.7–10.3)
Chloride: 101 mmol/L (ref 97–108)
GFR calc Af Amer: 74 mL/min/{1.73_m2} (ref >59)
GFR calc non Af Amer: 64 mL/min/{1.73_m2} (ref >59)
Glucose: 91 mg/dL (ref 65–99)
Potassium: 4.8 mmol/L (ref 3.5–5.2)
SODIUM: 141 mmol/L (ref 134–144)

## 2014-05-18 LAB — ROCKY MTN SPOTTED FVR ABS PNL(IGG+IGM)
RMSF IgG: NEGATIVE
RMSF IgM: 0.22 index (ref 0.00–0.89)

## 2014-05-18 LAB — VITAMIN D 25 HYDROXY (VIT D DEFICIENCY, FRACTURES): Vit D, 25-Hydroxy: 41.5 ng/mL (ref 30.0–100.0)

## 2014-05-20 ENCOUNTER — Encounter: Payer: Self-pay | Admitting: Family Medicine

## 2014-06-10 DIAGNOSIS — N952 Postmenopausal atrophic vaginitis: Secondary | ICD-10-CM | POA: Insufficient documentation

## 2014-07-30 ENCOUNTER — Ambulatory Visit (INDEPENDENT_AMBULATORY_CARE_PROVIDER_SITE_OTHER): Payer: Medicare PPO

## 2014-07-30 DIAGNOSIS — Z23 Encounter for immunization: Secondary | ICD-10-CM

## 2014-09-26 ENCOUNTER — Encounter: Payer: Self-pay | Admitting: Family Medicine

## 2014-09-26 ENCOUNTER — Telehealth: Payer: Self-pay

## 2014-09-26 ENCOUNTER — Ambulatory Visit (INDEPENDENT_AMBULATORY_CARE_PROVIDER_SITE_OTHER): Payer: Medicare PPO | Admitting: Family Medicine

## 2014-09-26 ENCOUNTER — Ambulatory Visit (INDEPENDENT_AMBULATORY_CARE_PROVIDER_SITE_OTHER): Payer: Medicare PPO

## 2014-09-26 VITALS — BP 132/81 | HR 61 | Temp 96.7°F | Ht <= 58 in | Wt 139.0 lb

## 2014-09-26 DIAGNOSIS — C641 Malignant neoplasm of right kidney, except renal pelvis: Secondary | ICD-10-CM

## 2014-09-26 DIAGNOSIS — I10 Essential (primary) hypertension: Secondary | ICD-10-CM

## 2014-09-26 DIAGNOSIS — M85632 Other cyst of bone, left forearm: Secondary | ICD-10-CM

## 2014-09-26 DIAGNOSIS — E559 Vitamin D deficiency, unspecified: Secondary | ICD-10-CM

## 2014-09-26 DIAGNOSIS — N8110 Cystocele, unspecified: Secondary | ICD-10-CM

## 2014-09-26 NOTE — Progress Notes (Signed)
Subjective:    Patient ID: Maria Camacho, female    DOB: [birth year], 80 y.o.   MRN: [id number]  HPI Pt here for follow up and management of chronic medical problems. She indicates that she is having bladder issues. She also has a sore place on her left forearm.         Patient Active Problem List   Diagnosis Date Noted  . History of renal cell carcinoma 05/16/2014  . Bladder prolapse, female, acquired 05/16/2014  . Vitamin D deficiency 05/16/2014  . Osteopenia 10/10/2013  . hyperlipidemia 03/14/2013  . Hypertension 03/14/2013  . Renal carcinoma 02/06/2013  . Routine gynecological examination 02/06/2013  . Postmenopausal vaginal bleeding 02/06/2013  . Hemorrhoid 02/06/2013   Outpatient Encounter Prescriptions as of 09/26/2014  Medication Sig  . amLODipine-valsartan (EXFORGE) 10-320 MG per tablet TAKE (1) TABLET DAILY AS DIRECTED.  . B Complex-C-E-Zn (BEC/ZINC) TABS Take 1 tablet by mouth daily.  . Cholecalciferol (VITAMIN D) 2000 UNITS tablet Take 2,000 Units by mouth daily.  Marland Kitchen conjugated estrogens (PREMARIN) vaginal cream Place 1 g vaginally daily.  . fish oil-omega-3 fatty acids 1000 MG capsule Take 1 g by mouth daily.  Vladimir Faster Glycol-Propyl Glycol (SYSTANE) 0.4-0.3 % SOLN Apply 1 drop to eye.    Review of Systems  Constitutional: Negative.   HENT: Negative.   Eyes: Negative.   Respiratory: Negative.   Cardiovascular: Negative.   Gastrointestinal: Negative.   Endocrine: Negative.   Genitourinary: Negative.        Pt states her bladder has dropped  Musculoskeletal: Negative.   Skin: Negative.        Left forearm - sore spot  Allergic/Immunologic: Negative.   Neurological: Negative.   Hematological: Negative.   Psychiatric/Behavioral: Negative.        Objective:   Physical Exam  Constitutional: She is oriented to person, place, and time. She appears well-developed and well-nourished.  HENT:  Head: Normocephalic and atraumatic.  Right Ear: External ear  normal.  Left Ear: External ear normal.  Nose: Nose normal.  Mouth/Throat: Oropharynx is clear and moist. No oropharyngeal exudate.  Eyes: Conjunctivae and EOM are normal. Pupils are equal, round, and reactive to light. Right eye exhibits no discharge. Left eye exhibits no discharge. No scleral icterus.  Neck: Normal range of motion. Neck supple. No thyromegaly present.   No Carotid bruits or anterior cervical adenopathy  Cardiovascular: Normal rate, regular rhythm, normal heart sounds and intact distal pulses.  Exam reveals no gallop and no friction rub.   No murmur heard. At 72/m  Pulmonary/Chest: Effort normal and breath sounds normal. No respiratory distress. She has no wheezes. She has no rales. She exhibits no tenderness.  Abdominal: Soft. Bowel sounds are normal. She exhibits no mass. There is no tenderness. There is no rebound and no guarding.    NO Abdominal masses or tenderness  Musculoskeletal: Normal range of motion. She exhibits no edema.  Lymphadenopathy:    She has no cervical adenopathy.  Neurological: She is alert and oriented to person, place, and time. She has normal reflexes. No cranial nerve deficit.  Skin: Skin is warm and dry. No rash noted.  Is a small cyst on the dorsal distal left forearm. This was clear as with Betadine solution and a tiny cyst ruptured with just the cleansing.  Psychiatric: She has a normal mood and affect. Her behavior is normal. Judgment and thought content normal.  Nursing note and vitals reviewed.  BP 132/81 mmHg  Pulse 61  Temp(Src) 96.7 F (35.9 C) (Oral)  Ht 4' 10"  (1.473 m)  Wt 139 lb (63.05 kg)  BMI 29.06 kg/m2  [facility] reading (PRIMARY) by  Dr. Brunilda Payor x-ray- no active disease                                     Assessment & Plan:  1. Essential hypertension - POCT CBC; Future - BMP8+EGFR; Future - Hepatic function panel; Future - NMR, lipoprofile; Future - DG Chest 2 View; Future  2. Vitamin D deficiency - POCT CBC;  Future - Vit D  25 hydroxy (rtn osteoporosis monitoring); Future  3. Bladder prolapse, female, acquired  4. Other cyst of bone, left forearm  5. Renal cell carcinoma  Patient Instructions                       Medicare Annual Wellness Visit  [facility] and the medical providers at [facility] strive to bring you the best medical care.  In doing so we not only want to address your current medical conditions and concerns but also to detect new conditions early and prevent illness, disease and health-related problems.    Medicare offers a yearly Wellness Visit which allows our clinical staff to assess your need for preventative services including immunizations, lifestyle education, counseling to decrease risk of preventable diseases and screening for fall risk and other medical concerns.    This visit is provided free of charge (no copay) for all Medicare recipients. The clinical pharmacists at [facility] have begun to conduct these Wellness Visits which will also include a thorough review of all your medications.    As you primary medical provider recommend that you make an appointment for your Annual Wellness Visit if you have not done so already this year.  You may set up this appointment before you leave today or you may call back ([phone]) and schedule an appointment.  Please make sure when you call that you mention that you are scheduling your Annual Wellness Visit with the clinical pharmacist so that the appointment may be made for the proper length of time.     Continue current medications. Continue good therapeutic lifestyle changes which include good diet and exercise. Fall precautions discussed with patient. If an FOBT was given today- please return it to our front desk. If you are over 86 years old - you may need Prevnar 72 or the adult Pneumonia vaccine.  Flu Shots will be available at our office starting mid- September. Please  call and schedule a FLU CLINIC APPOINTMENT.   Please follow-up with Dr. Jonette Eva regarding the use of Premarin vaginal cream for your prolapsed bladder This winter always drink plenty of fluids, keep the house cooler, and if needed use Mucinex maximum strength plain, blue and white in color, 1 twice daily with a large glass of water for cough and congestion. Also use saline nose spray as directed. Drink plenty of fluids If the area on your forearm continues to give you problems please let us know and we will try you on an antibiotic. Monitor blood pressures more regularly   Arrie Senate MD

## 2014-09-26 NOTE — Patient Instructions (Addendum)
Medicare Annual Wellness Visit  [facility] and the medical providers at [facility] strive to bring you the best medical care.  In doing so we not only want to address your current medical conditions and concerns but also to detect new conditions early and prevent illness, disease and health-related problems.    Medicare offers a yearly Wellness Visit which allows our clinical staff to assess your need for preventative services including immunizations, lifestyle education, counseling to decrease risk of preventable diseases and screening for fall risk and other medical concerns.    This visit is provided free of charge (no copay) for all Medicare recipients. The clinical pharmacists at [facility] have begun to conduct these Wellness Visits which will also include a thorough review of all your medications.    As you primary medical provider recommend that you make an appointment for your Annual Wellness Visit if you have not done so already this year.  You may set up this appointment before you leave today or you may call back ([phone]) and schedule an appointment.  Please make sure when you call that you mention that you are scheduling your Annual Wellness Visit with the clinical pharmacist so that the appointment may be made for the proper length of time.     Continue current medications. Continue good therapeutic lifestyle changes which include good diet and exercise. Fall precautions discussed with patient. If an FOBT was given today- please return it to our front desk. If you are over 70 years old - you may need Prevnar 70 or the adult Pneumonia vaccine.  Flu Shots will be available at our office starting mid- September. Please call and schedule a FLU CLINIC APPOINTMENT.   Please follow-up with Dr. Jonette Eva regarding the use of Premarin vaginal cream for your prolapsed bladder This winter always drink plenty of fluids,  keep the house cooler, and if needed use Mucinex maximum strength plain, blue and white in color, 1 twice daily with a large glass of water for cough and congestion. Also use saline nose spray as directed. Drink plenty of fluids If the area on your forearm continues to give you problems please let us know and we will try you on an antibiotic. Monitor blood pressures more regularly

## 2014-09-26 NOTE — Telephone Encounter (Signed)
Pt aware of CXR results.

## 2014-09-26 NOTE — Telephone Encounter (Signed)
-----   Message from Chipper Herb, MD sent at 09/26/2014  1:02 PM EST ----- As per radiology report

## 2014-10-11 ENCOUNTER — Other Ambulatory Visit (INDEPENDENT_AMBULATORY_CARE_PROVIDER_SITE_OTHER): Payer: Medicare PPO

## 2014-10-11 DIAGNOSIS — E559 Vitamin D deficiency, unspecified: Secondary | ICD-10-CM

## 2014-10-11 DIAGNOSIS — I10 Essential (primary) hypertension: Secondary | ICD-10-CM

## 2014-10-11 LAB — POCT CBC
Granulocyte percent: 52.6 %G (ref 37–80)
HCT, POC: 46.4 % (ref 37.7–47.9)
HEMOGLOBIN: 14.6 g/dL (ref 12.2–16.2)
Lymph, poc: 3.5 — AB (ref 0.6–3.4)
MCH: 29.2 pg (ref 27–31.2)
MCHC: 31.4 g/dL — AB (ref 31.8–35.4)
MCV: 92.9 fL (ref 80–97)
MPV: 8.5 fL (ref 0–99.8)
POC Granulocyte: 4.6 (ref 2–6.9)
POC LYMPH %: 40.4 % (ref 10–50)
Platelet Count, POC: 239 10*3/uL (ref 142–424)
RBC: 5 M/uL (ref 4.04–5.48)
RDW, POC: 13.1 %
WBC: 8.7 10*3/uL (ref 4.6–10.2)

## 2014-10-11 NOTE — Progress Notes (Signed)
Lab only 

## 2014-10-13 LAB — NMR, LIPOPROFILE
Cholesterol: 228 mg/dL — ABNORMAL HIGH (ref 100–199)
HDL Cholesterol by NMR: 93 mg/dL (ref >39)
HDL Particle Number: 45.6 umol/L (ref >=30.5)
LDL PARTICLE NUMBER: 883 nmol/L (ref <1000)
LDL SIZE: 21.3 nm (ref >20.5)
LDL-C: 110 mg/dL — ABNORMAL HIGH (ref 0–99)
LP-IR SCORE: 27 (ref <=45)
Triglycerides by NMR: 123 mg/dL (ref 0–149)

## 2014-10-13 LAB — HEPATIC FUNCTION PANEL
ALK PHOS: 61 IU/L (ref 39–117)
ALT: 18 IU/L (ref 0–32)
AST: 24 IU/L (ref 0–40)
Albumin: 4.4 g/dL (ref 3.5–4.8)
BILIRUBIN DIRECT: 0.13 mg/dL (ref 0.00–0.40)
BILIRUBIN TOTAL: 0.5 mg/dL (ref 0.0–1.2)
Total Protein: 7.2 g/dL (ref 6.0–8.5)

## 2014-10-13 LAB — BMP8+EGFR
BUN / CREAT RATIO: 12 (ref 11–26)
BUN: 11 mg/dL (ref 8–27)
CO2: 24 mmol/L (ref 18–29)
CREATININE: 0.94 mg/dL (ref 0.57–1.00)
Calcium: 9.9 mg/dL (ref 8.7–10.3)
Chloride: 103 mmol/L (ref 97–108)
GFR, EST AFRICAN AMERICAN: 67 mL/min/{1.73_m2} (ref >59)
GFR, EST NON AFRICAN AMERICAN: 58 mL/min/{1.73_m2} — AB (ref >59)
Glucose: 90 mg/dL (ref 65–99)
Potassium: 5.1 mmol/L (ref 3.5–5.2)
SODIUM: 141 mmol/L (ref 134–144)

## 2014-10-13 LAB — VITAMIN D 25 HYDROXY (VIT D DEFICIENCY, FRACTURES): VIT D 25 HYDROXY: 34.1 ng/mL (ref 30.0–100.0)

## 2014-10-14 ENCOUNTER — Telehealth: Payer: Self-pay

## 2014-10-14 NOTE — Telephone Encounter (Signed)
Pt aware of results and letter sent with results also per pt request

## 2014-10-14 NOTE — Telephone Encounter (Signed)
-----   Message from Chipper Herb, MD sent at 10/13/2014  9:02 PM EST ----- The blood sugar is good at 90. The creatinine the most important kidney function test is within normal limits. The potassium and electrolytes were also within normal limits. All liver function tests are within normal limits Cholesterol numbers with advanced lipid testing have a total LDL particle number that is good and at goal at 883. Previously this number was 1140. The LDL C is elevated at 110. The triglycerides are good at 123. The good cholesterol or the HDL particle number is also good.----10 you with fish oil and aggressive therapeutic lifestyle changes which include diet and exercise The vitamin D level was 34.1.----If the patient is taking vitamin D3 2000 daily, she should increase this to 2000 daily Monday through Friday and take 4000 on Saturday and Sunday

## 2014-11-18 ENCOUNTER — Ambulatory Visit: Payer: Self-pay

## 2014-11-25 ENCOUNTER — Encounter: Payer: Self-pay | Admitting: Pharmacist

## 2014-11-25 ENCOUNTER — Ambulatory Visit (INDEPENDENT_AMBULATORY_CARE_PROVIDER_SITE_OTHER): Payer: Medicare PPO | Admitting: Pharmacist

## 2014-11-25 VITALS — BP 128/76 | HR 65 | Ht <= 58 in | Wt 143.5 lb

## 2014-11-25 DIAGNOSIS — Z0000 Encounter for general adult medical examination without abnormal findings: Secondary | ICD-10-CM | POA: Diagnosis not present

## 2014-11-25 NOTE — Progress Notes (Addendum)
Patient ID: Maria Camacho, female   DOB: 24-Sep-1933, 81 y.o.   MRN: [id number] Subjective:    Maria Camacho is a 81 y.o. female who presents for Medicare Subsequent Annual [facility] Visit  Preventive Screening-Counseling & Management  Tobacco History  Smoking status  . Never Smoker   Smokeless tobacco  . Never Used      Current Problems (verified) Patient Active Problem List   Diagnosis Date Noted  . History of renal cell carcinoma 05/16/2014  . Bladder prolapse, female, acquired 05/16/2014  . Vitamin D deficiency 05/16/2014  . Osteopenia 10/10/2013  . hyperlipidemia 03/14/2013  . Hypertension 03/14/2013  . Renal carcinoma 02/06/2013  . Routine gynecological examination 02/06/2013  . Postmenopausal vaginal bleeding 02/06/2013  . Hemorrhoid 02/06/2013    Current Medications (verified) Current Outpatient Prescriptions  Medication Sig Dispense Refill  . amLODipine-valsartan (EXFORGE) 10-320 MG per tablet TAKE (1) TABLET DAILY AS DIRECTED. 30 tablet 4  . B Complex-C-E-Zn (BEC/ZINC) TABS Take 1 tablet by mouth daily.    . Cholecalciferol (VITAMIN D) 2000 UNITS tablet Take 2,000 Units by mouth daily.    Marland Kitchen conjugated estrogens (PREMARIN) vaginal cream Place 1 g vaginally daily.    . fish oil-omega-3 fatty acids 1000 MG capsule Take 1 g by mouth daily.    Vladimir Faster Glycol-Propyl Glycol (SYSTANE) 0.4-0.3 % SOLN Apply 1 drop to eye.     No current facility-administered medications for this visit.     Allergies (verified) Livalo; Penicillins; Actonel; Bacitracin; and Oxytrol   PAST HISTORY  Family History  Family History  Problem Relation Age of Onset  . Diabetes Mother   . Heart disease Mother     stent and stroke  . COPD Mother   . Macular degeneration Mother   . Stroke Mother   . Cancer Father     stomach  . Cancer Sister 30    ?uterine  . Obesity Sister   . Cancer Brother   . Cancer Brother   . Heart disease Brother     Social History History  Substance  Use Topics  . Smoking status: Never Smoker   . Smokeless tobacco: Never Used  . Alcohol Use: No     Are there smokers in your home (other than you)? No  Risk Factors Current exercise habits: Gym/ health club routine includes low impact aerobics and zumba at the [facility].  Exercises 2 or 3 days per week  Dietary issues discussed:  Patient has lost about 15# last year with a "nutrition diet" which appear to be a low carb diet, limiting processed foods, sweets and breads.  She can have non starchy vegetables and lean meats (as well as fruit 2 days per week.)  Cardiac risk factors: advanced age (older than 62 for men, 24 for women), dyslipidemia, family history of premature cardiovascular disease and hypertension.  Depression Screen (Note: if answer to either of the following is "Yes", a more complete depression screening is indicated)   Over the past 2 weeks, have you felt down, depressed or hopeless? No  Over the past 2 weeks, have you felt little interest or pleasure in doing things? No  Have you lost interest or pleasure in daily life? No  Do you often feel hopeless? No  Do you cry easily over simple problems? No  Activities of Daily Living In your present state of health, do you have any difficulty performing the following activities?:  Driving? No Managing money?  No Feeding yourself? No Getting from  bed to chair? No  Climbing a flight of stairs? No Preparing food and eating?: No Bathing or showering? No - not bath tub / only shower Getting dressed: No Getting to the toilet? No Using the toilet:No Moving around from place to place: No In the past year have you fallen or had a near fall?:No   Are you sexually active?  No  Do you have more than one partner?  No  Hearing Difficulties: No Do you often ask people to speak up or repeat themselves? No Do you experience ringing or noises in your ears? No Do you have difficulty understanding soft or whispered voices? No   Do  you feel that you have a problem with memory? Yes - patient writes information down in a book to remember appointments and dates.  She feels that her memory is not as good as it use to be.  Do you often misplace items? No  Do you feel safe at home?  Yes  Cognitive Testing -  Patient has had a dementia work up at the [facility] in [place] - April 2013   Alert? Yes    Normal Appearance?Yes  Oriented to person? Yes   Place? Yes  Time? Yes  Recall of three objects?  Yes  Can perform simple calculations? Yes  Displays appropriate judgment?Yes  Can read the correct time from a watch face?Yes   Advanced Directives have been discussed with the patient? Yes  List the Names of Other Physician/Practitioners you currently use: 1.  Dr. Heather Roberts - Urologist (treated renal cell carcinoma) - [place]- [phone] 2.  Dr Hassell Done - opthomologist - [facility] ([place], [place] - [phone] 3.  Dr. Emeterio Reeve - GYN   Immunization History  Administered Date(s) Administered  . Influenza,inj,Quad PF,36+ Mos 07/26/2013, 07/30/2014  . Pneumococcal Conjugate-13 12/26/2013    Screening Tests Health Maintenance  Topic Date Due  . INFLUENZA VACCINE  05/26/2015  . DEXA SCAN  10/11/2015  . MAMMOGRAM  02/20/2016  . COLONOSCOPY  03/26/2019  . TETANUS/TDAP  02/22/2021  . PNEUMOCOCCAL POLYSACCHARIDE VACCINE AGE 2 AND OVER  Completed  . ZOSTAVAX  Completed   DEXA - next due 09/2015 FOBT last 01/07/2014  All answers were reviewed with the patient and necessary referrals were made:  Cherre Robins, [facility]   11/25/2014   History reviewed: allergies, current medications, past family history, past medical history, past social history, past surgical history and problem list    Objective:     Body mass index is 30 kg/(m^2). BP 128/76 mmHg  Pulse 65  Ht 4\' 10"  (1.473 m)  Wt 143 lb 8 oz (65.091 kg)  BMI 30.00 kg/m2     Assessment:     Annual Wellness Visit       Plan:     During the course of the visit the patient was educated and counseled about appropriate screening and preventive services including:    Pneumococcal vaccine - Completed  Influenza vaccine - UTD  Td vaccine - UTD  Screening mammography - UTD  Screening Pap smear and pelvic exam - no longer required  Bone densitometry screening - next due 09/2015 - referral sent  Colorectal cancer screening - UTD  Diabetes screening - UTD  Glaucoma screening - UTD, will request record of last visit from Dr Hassell Done  Nutrition counseling - OK to restart "nutrition Diet" decribed above by patient since she had success in past with weight loss.   Patient Instructions (the written  plan) was given to the patient.  Medicare Attestation I have personally reviewed: The patient's medical and social history Their use of alcohol, tobacco or illicit drugs Their current medications and supplements The patient's functional ability including ADLs,fall risks, home safety risks, cognitive, and hearing and visual impairment Diet and physical activities Evidence for depression or mood disorders  The patient's weight, height, BMI, and BP have been recorded in the chart.  I have made referrals, counseling, and provided education to the patient based on review of the above and I have provided the patient with a written personalized care plan for preventive services.     Cherre Robins, [facility]   11/25/2014   11/28/2014 Received report from visit on 06/03/2014 with optometrist Dr Mayford Knife.  Noted suspect glaucoma.   Tonometry results by tonopen:  Right:  62mmHg  Left:  51mmHg Notes also scanned into e chart.

## 2014-11-25 NOTE — Patient Instructions (Signed)
Health Maintenance Summary     INFLUENZA VACCINE Next Due 05/26/2015 Last done 07/30/2014    DEXA SCAN Next Due 10/11/2015 Last done 10/10/2013    MAMMOGRAM Next Due 02/20/2016 Last done 02/19/2014   Zostavax / shingles vaccine  Completed     Pneumonia Completed     Fecal Occult Blood Test / Stool Sample Next Due 01/08/2015 Last done 01/07/2014    COLONOSCOPY Next Due 03/26/2019 Last done 03/25/2009    TETANUS/TDAP Next Due 02/22/2021 Last doen 02/23/2011       Preventive Care for Adults A healthy lifestyle and preventive care can promote health and wellness. Preventive health guidelines for women include the following key practices.  A routine yearly physical is a good way to check with your health care provider about your health and preventive screening. It is a chance to share any concerns and updates on your health and to receive a thorough exam.  Visit your dentist for a routine exam and preventive care every 6 months. Brush your teeth twice a day and floss once a day. Good oral hygiene prevents tooth decay and gum disease.  The frequency of eye exams is based on your age, health, family medical history, use of contact lenses, and other factors. Follow your health care provider's recommendations for frequency of eye exams.  Eat a healthy diet. Foods like vegetables, fruits, whole grains, low-fat dairy products, and lean protein foods contain the nutrients you need without too many calories. Decrease your intake of foods high in solid fats, added sugars, and salt. Eat the right amount of calories for you.Get information about a proper diet from your health care provider, if necessary.  Regular physical exercise is one of the most important things you can do for your health. Most adults should get at least 150 minutes of moderate-intensity exercise (any activity that increases your heart rate and causes you to sweat) each week. In addition, most adults need muscle-strengthening exercises on 2 or  more days a week.  Maintain a healthy weight. The body mass index (BMI) is a screening tool to identify possible weight problems. It provides an estimate of body fat based on height and weight. Your health care provider can find your BMI and can help you achieve or maintain a healthy weight.For adults 20 years and older:  A BMI below 18.5 is considered underweight.  A BMI of 18.5 to 24.9 is normal.  A BMI of 25 to 29.9 is considered overweight.  A BMI of 30 and above is considered obese.  Maintain normal blood lipids and cholesterol levels by exercising and minimizing your intake of saturated fat. Eat a balanced diet with plenty of fruit and vegetables. Blood tests for lipids and cholesterol should begin at age 60 and be repeated every 5 years. If your lipid or cholesterol levels are high, you are over 50, or you are at high risk for heart disease, you may need your cholesterol levels checked more frequently.Ongoing high lipid and cholesterol levels should be treated with medicines if diet and exercise are not working.  If you smoke, find out from your health care provider how to quit. If you do not use tobacco, do not start.  Lung cancer screening is recommended for adults aged 87-80 years who are at high risk for developing lung cancer because of a history of smoking. A yearly low-dose CT scan of the lungs is recommended for people who have at least a 30-pack-year history of smoking and are a current smoker  or have quit within the past 15 years. A pack year of smoking is smoking an average of 1 pack of cigarettes a day for 1 year (for example: 1 pack a day for 30 years or 2 packs a day for 15 years). Yearly screening should continue until the smoker has stopped smoking for at least 15 years. Yearly screening should be stopped for people who develop a health problem that would prevent them from having lung cancer treatment.  If you are pregnant, do not drink alcohol. If you are breastfeeding, be  very cautious about drinking alcohol. If you are not pregnant and choose to drink alcohol, do not have more than 1 drink per day. One drink is considered to be 12 ounces (355 mL) of beer, 5 ounces (148 mL) of wine, or 1.5 ounces (44 mL) of liquor.  Avoid use of street drugs. Do not share needles with anyone. Ask for help if you need support or instructions about stopping the use of drugs.  High blood pressure causes heart disease and increases the risk of stroke. Your blood pressure should be checked at least every 1 to 2 years. Ongoing high blood pressure should be treated with medicines if weight loss and exercise do not work.  If you are 19-57 years old, ask your health care provider if you should take aspirin to prevent strokes.  Diabetes screening involves taking a blood sample to check your fasting blood sugar level. This should be done once every 3 years, after age 4, if you are within normal weight and without risk factors for diabetes. Testing should be considered at a younger age or be carried out more frequently if you are overweight and have at least 1 risk factor for diabetes.  Breast cancer screening is essential preventive care for women. You should practice "breast self-awareness." This means understanding the normal appearance and feel of your breasts and may include breast self-examination. Any changes detected, no matter how small, should be reported to a health care provider. Women in their 79s and 30s should have a clinical breast exam (CBE) by a health care provider as part of a regular health exam every 1 to 3 years. After age 34, women should have a CBE every year. Starting at age 56, women should consider having a mammogram (breast X-ray test) every year. Women who have a family history of breast cancer should talk to their health care provider about genetic screening. Women at a high risk of breast cancer should talk to their health care providers about having an MRI and a  mammogram every year.  Breast cancer gene (BRCA)-related cancer risk assessment is recommended for women who have family members with BRCA-related cancers. BRCA-related cancers include breast, ovarian, tubal, and peritoneal cancers. Having family members with these cancers may be associated with an increased risk for harmful changes (mutations) in the breast cancer genes BRCA1 and BRCA2. Results of the assessment will determine the need for genetic counseling and BRCA1 and BRCA2 testing.  Routine pelvic exams to screen for cancer are no longer recommended for nonpregnant women who are considered low risk for cancer of the pelvic organs (ovaries, uterus, and vagina) and who do not have symptoms. Ask your health care provider if a screening pelvic exam is right for you.  If you have had past treatment for cervical cancer or a condition that could lead to cancer, you need Pap tests and screening for cancer for at least 20 years after your treatment. If Pap tests  have been discontinued, your risk factors (such as having a new sexual partner) need to be reassessed to determine if screening should be resumed. Some women have medical problems that increase the chance of getting cervical cancer. In these cases, your health care provider may recommend more frequent screening and Pap tests.  The HPV test is an additional test that may be used for cervical cancer screening. The HPV test looks for the virus that can cause the cell changes on the cervix. The cells collected during the Pap test can be tested for HPV. The HPV test could be used to screen women aged 57 years and older, and should be used in women of any age who have unclear Pap test results. After the age of 22, women should have HPV testing at the same frequency as a Pap test.  Colorectal cancer can be detected and often prevented. Most routine colorectal cancer screening begins at the age of 31 years and continues through age 56 years. However, your  health care provider may recommend screening at an earlier age if you have risk factors for colon cancer. On a yearly basis, your health care provider may provide home test kits to check for hidden blood in the stool. Use of a small camera at the end of a tube, to directly examine the colon (sigmoidoscopy or colonoscopy), can detect the earliest forms of colorectal cancer. Talk to your health care provider about this at age 67, when routine screening begins. Direct exam of the colon should be repeated every 5-10 years through age 21 years, unless early forms of pre-cancerous polyps or small growths are found.  People who are at an increased risk for hepatitis B should be screened for this virus. You are considered at high risk for hepatitis B if:  You were born in a country where hepatitis B occurs often. Talk with your health care provider about which countries are considered high risk.  Your parents were born in a high-risk country and you have not received a shot to protect against hepatitis B (hepatitis B vaccine).  You have HIV or AIDS.  You use needles to inject street drugs.  You live with, or have sex with, someone who has hepatitis B.  You get hemodialysis treatment.  You take certain medicines for conditions like cancer, organ transplantation, and autoimmune conditions.  Hepatitis C blood testing is recommended for all people born from [birth year] through 1965 and any individual with known risks for hepatitis C.  Practice safe sex. Use condoms and avoid high-risk sexual practices to reduce the spread of sexually transmitted infections (STIs). STIs include gonorrhea, chlamydia, syphilis, trichomonas, herpes, HPV, and human immunodeficiency virus (HIV). Herpes, HIV, and HPV are viral illnesses that have no cure. They can result in disability, cancer, and death.  You should be screened for sexually transmitted illnesses (STIs) including gonorrhea and chlamydia if:  You are sexually active  and are younger than 24 years.  You are older than 24 years and your health care provider tells you that you are at risk for this type of infection.  Your sexual activity has changed since you were last screened and you are at an increased risk for chlamydia or gonorrhea. Ask your health care provider if you are at risk.  If you are at risk of being infected with HIV, it is recommended that you take a prescription medicine daily to prevent HIV infection. This is called preexposure prophylaxis (PrEP). You are considered at risk if:  You  are a heterosexual woman, are sexually active, and are at increased risk for HIV infection.  You take drugs by injection.  You are sexually active with a partner who has HIV.  Talk with your health care provider about whether you are at high risk of being infected with HIV. If you choose to begin PrEP, you should first be tested for HIV. You should then be tested every 3 months for as long as you are taking PrEP.  Osteoporosis is a disease in which the bones lose minerals and strength with aging. This can result in serious bone fractures or breaks. The risk of osteoporosis can be identified using a bone density scan. Women ages 36 years and over and women at risk for fractures or osteoporosis should discuss screening with their health care providers. Ask your health care provider whether you should take a calcium supplement or vitamin D to reduce the rate of osteoporosis.  Menopause can be associated with physical symptoms and risks. Hormone replacement therapy is available to decrease symptoms and risks. You should talk to your health care provider about whether hormone replacement therapy is right for you.  Use sunscreen. Apply sunscreen liberally and repeatedly throughout the day. You should seek shade when your shadow is shorter than you. Protect yourself by wearing long sleeves, pants, a wide-brimmed hat, and sunglasses year round, whenever you are  outdoors.  Once a month, do a whole body skin exam, using a mirror to look at the skin on your back. Tell your health care provider of new moles, moles that have irregular borders, moles that are larger than a pencil eraser, or moles that have changed in shape or color.  Stay current with required vaccines (immunizations).  Influenza vaccine. All adults should be immunized every year.  Tetanus, diphtheria, and acellular pertussis (Td, Tdap) vaccine. Pregnant women should receive 1 dose of Tdap vaccine during each pregnancy. The dose should be obtained regardless of the length of time since the last dose. Immunization is preferred during the 27th-36th week of gestation. An adult who has not previously received Tdap or who does not know her vaccine status should receive 1 dose of Tdap. This initial dose should be followed by tetanus and diphtheria toxoids (Td) booster doses every 10 years. Adults with an unknown or incomplete history of completing a 3-dose immunization series with Td-containing vaccines should begin or complete a primary immunization series including a Tdap dose. Adults should receive a Td booster every 10 years.  Varicella vaccine. An adult without evidence of immunity to varicella should receive 2 doses or a second dose if she has previously received 1 dose. Pregnant females who do not have evidence of immunity should receive the first dose after pregnancy. This first dose should be obtained before leaving the health care facility. The second dose should be obtained 4-8 weeks after the first dose.  Human papillomavirus (HPV) vaccine. Females aged 13-26 years who have not received the vaccine previously should obtain the 3-dose series. The vaccine is not recommended for use in pregnant females. However, pregnancy testing is not needed before receiving a dose. If a female is found to be pregnant after receiving a dose, no treatment is needed. In that case, the remaining doses should be  delayed until after the pregnancy. Immunization is recommended for any person with an immunocompromised condition through the age of 36 years if she did not get any or all doses earlier. During the 3-dose series, the second dose should be obtained 4-8  weeks after the first dose. The third dose should be obtained 24 weeks after the first dose and 16 weeks after the second dose.  Zoster vaccine. One dose is recommended for adults aged 66 years or older unless certain conditions are present.  Measles, mumps, and rubella (MMR) vaccine. Adults born before [birth year] generally are considered immune to measles and mumps. Adults born in [birth year] or later should have 1 or more doses of MMR vaccine unless there is a contraindication to the vaccine or there is laboratory evidence of immunity to each of the three diseases. A routine second dose of MMR vaccine should be obtained at least 28 days after the first dose for students attending postsecondary schools, health care workers, or international travelers. People who received inactivated measles vaccine or an unknown type of measles vaccine during 1963-1967 should receive 2 doses of MMR vaccine. People who received inactivated mumps vaccine or an unknown type of mumps vaccine before 1979 and are at high risk for mumps infection should consider immunization with 2 doses of MMR vaccine. For females of childbearing age, rubella immunity should be determined. If there is no evidence of immunity, females who are not pregnant should be vaccinated. If there is no evidence of immunity, females who are pregnant should delay immunization until after pregnancy. Unvaccinated health care workers born before [birth year] who lack laboratory evidence of measles, mumps, or rubella immunity or laboratory confirmation of disease should consider measles and mumps immunization with 2 doses of MMR vaccine or rubella immunization with 1 dose of MMR vaccine.  Pneumococcal 13-valent conjugate (PCV13) vaccine.  When indicated, a person who is uncertain of her immunization history and has no record of immunization should receive the PCV13 vaccine. An adult aged 66 years or older who has certain medical conditions and has not been previously immunized should receive 1 dose of PCV13 vaccine. This PCV13 should be followed with a dose of pneumococcal polysaccharide (PPSV23) vaccine. The PPSV23 vaccine dose should be obtained at least 8 weeks after the dose of PCV13 vaccine. An adult aged 19 years or older who has certain medical conditions and previously received 1 or more doses of PPSV23 vaccine should receive 1 dose of PCV13. The PCV13 vaccine dose should be obtained 1 or more years after the last PPSV23 vaccine dose.  Pneumococcal polysaccharide (PPSV23) vaccine. When PCV13 is also indicated, PCV13 should be obtained first. All adults aged 84 years and older should be immunized. An adult younger than age 37 years who has certain medical conditions should be immunized. Any person who resides in a nursing home or long-term care facility should be immunized. An adult smoker should be immunized. People with an immunocompromised condition and certain other conditions should receive both PCV13 and PPSV23 vaccines. People with human immunodeficiency virus (HIV) infection should be immunized as soon as possible after diagnosis. Immunization during chemotherapy or radiation therapy should be avoided. Routine use of PPSV23 vaccine is not recommended for American Indians, [place] Natives, or people younger than 65 years unless there are medical conditions that require PPSV23 vaccine. When indicated, people who have unknown immunization and have no record of immunization should receive PPSV23 vaccine. One-time revaccination 5 years after the first dose of PPSV23 is recommended for people aged 19-64 years who have chronic kidney failure, nephrotic syndrome, asplenia, or immunocompromised conditions. People who received 1-2 doses of  PPSV23 before age 41 years should receive another dose of PPSV23 vaccine at age 27 years or later if at least 5 years  have passed since the previous dose. Doses of PPSV23 are not needed for people immunized with PPSV23 at or after age 2 years.  Meningococcal vaccine. Adults with asplenia or persistent complement component deficiencies should receive 2 doses of quadrivalent meningococcal conjugate (MenACWY-D) vaccine. The doses should be obtained at least 2 months apart. Microbiologists working with certain meningococcal bacteria, [employer] recruits, people at risk during an outbreak, and people who travel to or live in countries with a high rate of meningitis should be immunized. A first-year college student up through age 35 years who is living in a residence hall should receive a dose if she did not receive a dose on or after her 16th birthday. Adults who have certain high-risk conditions should receive one or more doses of vaccine.  Hepatitis A vaccine. Adults who wish to be protected from this disease, have certain high-risk conditions, work with hepatitis A-infected animals, work in hepatitis A research labs, or travel to or work in countries with a high rate of hepatitis A should be immunized. Adults who were previously unvaccinated and who anticipate close contact with an international adoptee during the first 60 days after arrival in the [place] from a country with a high rate of hepatitis A should be immunized.  Hepatitis B vaccine. Adults who wish to be protected from this disease, have certain high-risk conditions, may be exposed to blood or other infectious body fluids, are household contacts or sex partners of hepatitis B positive people, are clients or workers in certain care facilities, or travel to or work in countries with a high rate of hepatitis B should be immunized.  Haemophilus influenzae type b (Hib) vaccine. A previously unvaccinated person with asplenia or sickle cell disease  or having a scheduled splenectomy should receive 1 dose of Hib vaccine. Regardless of previous immunization, a recipient of a hematopoietic stem cell transplant should receive a 3-dose series 6-12 months after her successful transplant. Hib vaccine is not recommended for adults with HIV infection. Preventive Services / Frequency Ages 19 years and over  Blood pressure check.** / Every 1 to 2 years.  Lipid and cholesterol check.** / Every 5 years beginning at age 18 years.  Lung cancer screening. / Every year if you are aged 41-80 years and have a 30-pack-year history of smoking and currently smoke or have quit within the past 15 years. Yearly screening is stopped once you have quit smoking for at least 15 years or develop a health problem that would prevent you from having lung cancer treatment.  Clinical breast exam.** / Every year after age 42 years.  BRCA-related cancer risk assessment.** / For women who have family members with a BRCA-related cancer (breast, ovarian, tubal, or peritoneal cancers).  Mammogram.** / Every year beginning at age 4 years and continuing for as long as you are in good health. Consult with your health care provider.  Pap test.** / Every 3 years starting at age 59 years through age 25 or 35 years with 3 consecutive normal Pap tests. Testing can be stopped between 65 and 70 years with 3 consecutive normal Pap tests and no abnormal Pap or HPV tests in the past 10 years.  HPV screening.** / Every 3 years from ages 14 years through ages 65 or 64 years with a history of 3 consecutive normal Pap tests. Testing can be stopped between 65 and 70 years with 3 consecutive normal Pap tests and no abnormal Pap or HPV tests in the past 10 years.  Fecal  occult blood test (FOBT) of stool. / Every year beginning at age 57 years and continuing until age 1 years. You may not need to do this test if you get a colonoscopy every 10 years.  Flexible sigmoidoscopy or colonoscopy.** /  Every 5 years for a flexible sigmoidoscopy or every 10 years for a colonoscopy beginning at age 74 years and continuing until age 39 years.  Hepatitis C blood test.** / For all people born from [birth year] through 1963 and any individual with known risks for hepatitis C.  Osteoporosis screening.** / A one-time screening for women ages 42 years and over and women at risk for fractures or osteoporosis.  Skin self-exam. / Monthly.  Influenza vaccine. / Every year.  Tetanus, diphtheria, and acellular pertussis (Tdap/Td) vaccine.** / 1 dose of Td every 10 years.  Varicella vaccine.** / Consult your health care provider.  Zoster vaccine.** / 1 dose for adults aged 34 years or older.  Pneumococcal 13-valent conjugate (PCV13) vaccine.** / Consult your health care provider.  Pneumococcal polysaccharide (PPSV23) vaccine.** / 1 dose for all adults aged 23 years and older.  Meningococcal vaccine.** / Consult your health care provider.  Hepatitis A vaccine.** / Consult your health care provider.  Hepatitis B vaccine.** / Consult your health care provider.  Haemophilus influenzae type b (Hib) vaccine.** / Consult your health care provider.

## 2015-01-01 ENCOUNTER — Other Ambulatory Visit: Payer: Self-pay | Admitting: Family Medicine

## 2015-01-16 DIAGNOSIS — Z85528 Personal history of other malignant neoplasm of kidney: Secondary | ICD-10-CM | POA: Insufficient documentation

## 2015-02-05 ENCOUNTER — Encounter: Payer: Self-pay | Admitting: Family Medicine

## 2015-02-05 ENCOUNTER — Ambulatory Visit (INDEPENDENT_AMBULATORY_CARE_PROVIDER_SITE_OTHER): Payer: Medicare PPO | Admitting: Family Medicine

## 2015-02-05 VITALS — BP 131/79 | HR 66 | Temp 97.1°F | Ht <= 58 in | Wt 143.0 lb

## 2015-02-05 DIAGNOSIS — E785 Hyperlipidemia, unspecified: Secondary | ICD-10-CM | POA: Diagnosis not present

## 2015-02-05 DIAGNOSIS — N8110 Cystocele, unspecified: Secondary | ICD-10-CM | POA: Diagnosis not present

## 2015-02-05 DIAGNOSIS — Z85528 Personal history of other malignant neoplasm of kidney: Secondary | ICD-10-CM | POA: Diagnosis not present

## 2015-02-05 DIAGNOSIS — Z780 Asymptomatic menopausal state: Secondary | ICD-10-CM | POA: Diagnosis not present

## 2015-02-05 DIAGNOSIS — I10 Essential (primary) hypertension: Secondary | ICD-10-CM

## 2015-02-05 DIAGNOSIS — Z139 Encounter for screening, unspecified: Secondary | ICD-10-CM

## 2015-02-05 DIAGNOSIS — IMO0002 Reserved for concepts with insufficient information to code with codable children: Secondary | ICD-10-CM

## 2015-02-05 DIAGNOSIS — E559 Vitamin D deficiency, unspecified: Secondary | ICD-10-CM | POA: Diagnosis not present

## 2015-02-05 NOTE — Progress Notes (Signed)
Subjective:    Patient ID: Maria Camacho, female    DOB: 1932/11/20, 81 y.o.   MRN: [id number]  HPI Pt here for follow up and management of chronic medical problems which includes hyperlipidemia and hypertension. She is taking medications regularly. The patient has recently seen her urologist, Dr. Nevada Crane, who follows her for her renal cell carcinoma excision. She has a bladder prolapse and she is seriously considering having surgery to repair this. The patient recently had a fall and since the fall has been having more right side and back pain and this occurred 1 month ago. She also has a skin lesion on her face which she is concerned about. She is due to get lab work and she will return to the office fasting for this. She will be given an FOBT to return today. She is also due to get her mammogram and she plans to get this done soon.        Patient Active Problem List   Diagnosis Date Noted  . History of renal cell carcinoma 05/16/2014  . Bladder prolapse, female, acquired 05/16/2014  . Vitamin D deficiency 05/16/2014  . Osteopenia 10/10/2013  . hyperlipidemia 03/14/2013  . Hypertension 03/14/2013  . Renal carcinoma 02/06/2013  . Routine gynecological examination 02/06/2013  . Postmenopausal vaginal bleeding 02/06/2013  . Hemorrhoid 02/06/2013   Outpatient Encounter Prescriptions as of 02/05/2015  Medication Sig  . amLODipine-valsartan (EXFORGE) 10-320 MG per tablet TAKE (1) TABLET DAILY AS DIRECTED.  . B Complex-C-E-Zn (BEC/ZINC) TABS Take 1 tablet by mouth daily.  . Cholecalciferol (VITAMIN D) 2000 UNITS tablet Take 2,000 Units by mouth daily.  Marland Kitchen conjugated estrogens (PREMARIN) vaginal cream Place 1 g vaginally daily.  . fish oil-omega-3 fatty acids 1000 MG capsule Take 1 g by mouth daily.  Vladimir Faster Glycol-Propyl Glycol (SYSTANE) 0.4-0.3 % SOLN Apply 1 drop to eye.  . [DISCONTINUED] conjugated estrogens (PREMARIN) vaginal cream Place vaginally.    Review of Systems    Constitutional: Negative.   HENT: Negative.   Eyes: Negative.   Respiratory: Negative.   Cardiovascular: Negative.   Gastrointestinal: Negative.   Endocrine: Negative.   Genitourinary: Negative.   Musculoskeletal: Positive for myalgias (right side/ back pain from a fall 1 month ago.).  Skin: Positive for color change (redness on forehead).  Allergic/Immunologic: Negative.   Neurological: Negative.   Hematological: Negative.   Psychiatric/Behavioral: Negative.        Objective:   Physical Exam  Constitutional: She is oriented to person, place, and time. She appears well-developed and well-nourished.  The patient is pleasant and alert and in good spirits  HENT:  Head: Normocephalic and atraumatic.  Right Ear: External ear normal.  Left Ear: External ear normal.  Nose: Nose normal.  Mouth/Throat: Oropharynx is clear and moist.  Eyes: Conjunctivae and EOM are normal. Pupils are equal, round, and reactive to light. Right eye exhibits no discharge. Left eye exhibits no discharge. No scleral icterus.  Neck: Normal range of motion. Neck supple. No thyromegaly present.  No carotid bruits or anterior cervical adenopathy  Cardiovascular: Normal rate, regular rhythm, normal heart sounds and intact distal pulses.  Exam reveals no gallop and no friction rub.   No murmur heard. The heart has a regular rate and rhythm at 72/m  Pulmonary/Chest: Effort normal and breath sounds normal. No respiratory distress. She has no wheezes. She has no rales. She exhibits no tenderness.  Lungs are clear anteriorly and posteriorly  Abdominal: Soft. Bowel sounds are normal. She  exhibits no mass. There is no tenderness. There is no rebound and no guarding.  Musculoskeletal: Normal range of motion. She exhibits no edema or tenderness.  She is recovering well and getting better from the recent fall she had.  Lymphadenopathy:    She has no cervical adenopathy.  Neurological: She is alert and oriented to person,  place, and time. She has normal reflexes. No cranial nerve deficit.  Skin: Skin is warm and dry. No rash noted. No erythema.  Psychiatric: She has a normal mood and affect. Her behavior is normal. Judgment and thought content normal.  Nursing note and vitals reviewed.  BP 131/79 mmHg  Pulse 66  Temp(Src) 97.1 F (36.2 C) (Oral)  Ht 4' 10"  (1.473 m)  Wt 143 lb (64.864 kg)  BMI 29.89 kg/m2        Assessment & Plan:  1. Essential hypertension -The blood pressure is good today she should continue with her current medication. - POCT CBC; Future - BMP8+EGFR; Future - Hepatic function panel; Future  2. Vitamin D deficiency -The vitamin D has been good in the past and she should continue with current treatment pending results of lab work - POCT CBC; Future - Vit D  25 hydroxy (rtn osteoporosis monitoring); Future  3. History of renal cell carcinoma -According to Dr. Nevada Crane she needs no further follow-up for the renal cell carcinoma. - POCT CBC; Future - BMP8+EGFR; Future  4. Postmenopausal -She is doing well with her postmenopausal symptoms and no specific complaints. - POCT CBC; Future  5. Hyperlipidemia -She continues to watch her diet and until lab work is returned she should continue with omega-3 fatty acids - POCT CBC; Future - NMR, lipoprofile; Future  6. Cystocele -She will follow-up in the future with a different urologist for possible repair of this.  7. Screening -She has a couple skin lesions on her face which need to be evaluated and possibly treated and she needs a good overall skin check. - Ambulatory referral to Dermatology  Patient Instructions                       Medicare Annual Wellness Visit  [facility] and the medical providers at [facility] strive to bring you the best medical care.  In doing so we not only want to address your current medical conditions and concerns but also to detect new conditions early and prevent illness,  disease and health-related problems.    Medicare offers a yearly Wellness Visit which allows our clinical staff to assess your need for preventative services including immunizations, lifestyle education, counseling to decrease risk of preventable diseases and screening for fall risk and other medical concerns.    This visit is provided free of charge (no copay) for all Medicare recipients. The clinical pharmacists at [facility] have begun to conduct these Wellness Visits which will also include a thorough review of all your medications.    As you primary medical provider recommend that you make an appointment for your Annual Wellness Visit if you have not done so already this year.  You may set up this appointment before you leave today or you may call back ([phone]) and schedule an appointment.  Please make sure when you call that you mention that you are scheduling your Annual Wellness Visit with the clinical pharmacist so that the appointment may be made for the proper length of time.     Continue current medications. Continue good  therapeutic lifestyle changes which include good diet and exercise. Fall precautions discussed with patient. If an FOBT was given today- please return it to our front desk. If you are over 57 years old - you may need Prevnar 16 or the adult Pneumonia vaccine.  Flu Shots are still available at our office. If you still haven't had one please call to set up a nurse visit to get one.   After your visit with [place] today you will receive a survey in the mail or online from [employer] regarding your care with [place]. Please take a moment to fill this out. Your feedback is very important to [place] as you can help [place] better understand your patient needs as well as improve your experience and satisfaction. WE CARE ABOUT YOU!!!   The patient should continue to drink plenty of fluids She should be careful with any climbing whatsoever and she should use good  judgment if she does have to climb in doing things around her house She should take Mucinex plain, blue and white in color, 1 twice daily for cough and congestion She should use nasal saline frequently for the next few days just to keep herself more moisturizers and her nasal passages She should follow-up with the urologist for the bladder prolapse problems.   Arrie Senate MD

## 2015-02-05 NOTE — Patient Instructions (Addendum)
Medicare Annual Wellness Visit  [facility] and the medical providers at [facility] strive to bring you the best medical care.  In doing so we not only want to address your current medical conditions and concerns but also to detect new conditions early and prevent illness, disease and health-related problems.    Medicare offers a yearly Wellness Visit which allows our clinical staff to assess your need for preventative services including immunizations, lifestyle education, counseling to decrease risk of preventable diseases and screening for fall risk and other medical concerns.    This visit is provided free of charge (no copay) for all Medicare recipients. The clinical pharmacists at [facility] have begun to conduct these Wellness Visits which will also include a thorough review of all your medications.    As you primary medical provider recommend that you make an appointment for your Annual Wellness Visit if you have not done so already this year.  You may set up this appointment before you leave today or you may call back ([phone]) and schedule an appointment.  Please make sure when you call that you mention that you are scheduling your Annual Wellness Visit with the clinical pharmacist so that the appointment may be made for the proper length of time.     Continue current medications. Continue good therapeutic lifestyle changes which include good diet and exercise. Fall precautions discussed with patient. If an FOBT was given today- please return it to our front desk. If you are over 87 years old - you may need Prevnar 22 or the adult Pneumonia vaccine.  Flu Shots are still available at our office. If you still haven't had one please call to set up a nurse visit to get one.   After your visit with [place] today you will receive a survey in the mail or online from [employer] regarding your care with [place]. Please take a moment to  fill this out. Your feedback is very important to [place] as you can help [place] better understand your patient needs as well as improve your experience and satisfaction. WE CARE ABOUT YOU!!!   The patient should continue to drink plenty of fluids She should be careful with any climbing whatsoever and she should use good judgment if she does have to climb in doing things around her house She should take Mucinex plain, blue and white in color, 1 twice daily for cough and congestion She should use nasal saline frequently for the next few days just to keep herself more moisturizers and her nasal passages She should follow-up with the urologist for the bladder prolapse problems.

## 2015-02-14 ENCOUNTER — Other Ambulatory Visit (INDEPENDENT_AMBULATORY_CARE_PROVIDER_SITE_OTHER): Payer: Medicare PPO

## 2015-02-14 ENCOUNTER — Other Ambulatory Visit: Payer: Medicare PPO

## 2015-02-14 DIAGNOSIS — Z85528 Personal history of other malignant neoplasm of kidney: Secondary | ICD-10-CM | POA: Diagnosis not present

## 2015-02-14 DIAGNOSIS — Z1212 Encounter for screening for malignant neoplasm of rectum: Secondary | ICD-10-CM

## 2015-02-14 DIAGNOSIS — E785 Hyperlipidemia, unspecified: Secondary | ICD-10-CM | POA: Diagnosis not present

## 2015-02-14 DIAGNOSIS — I10 Essential (primary) hypertension: Secondary | ICD-10-CM | POA: Diagnosis not present

## 2015-02-14 DIAGNOSIS — Z780 Asymptomatic menopausal state: Secondary | ICD-10-CM | POA: Diagnosis not present

## 2015-02-14 DIAGNOSIS — E559 Vitamin D deficiency, unspecified: Secondary | ICD-10-CM | POA: Diagnosis not present

## 2015-02-14 LAB — POCT CBC
Granulocyte percent: 51.2 %G (ref 37–80)
HCT, POC: 46.1 % (ref 37.7–47.9)
Hemoglobin: 14.4 g/dL (ref 12.2–16.2)
Lymph, poc: 3 (ref 0.6–3.4)
MCH, POC: 28.6 pg (ref 27–31.2)
MCHC: 31.3 g/dL — AB (ref 31.8–35.4)
MCV: 91.5 fL (ref 80–97)
MPV: 8.5 fL (ref 0–99.8)
POC Granulocyte: 3.8 (ref 2–6.9)
POC LYMPH %: 40.5 % (ref 10–50)
Platelet Count, POC: 250 10*3/uL (ref 142–424)
RBC: 5.04 M/uL (ref 4.04–5.48)
RDW, POC: 13.2 %
WBC: 7.5 10*3/uL (ref 4.6–10.2)

## 2015-02-14 NOTE — Progress Notes (Signed)
Lab only 

## 2015-02-14 NOTE — Progress Notes (Signed)
LAB ONLY 

## 2015-02-16 LAB — BMP8+EGFR
BUN/Creatinine Ratio: 14 (ref 11–26)
BUN: 13 mg/dL (ref 8–27)
CO2: 24 mmol/L (ref 18–29)
Calcium: 9.6 mg/dL (ref 8.7–10.3)
Chloride: 102 mmol/L (ref 97–108)
Creatinine, Ser: 0.9 mg/dL (ref 0.57–1.00)
GFR calc Af Amer: 70 mL/min/{1.73_m2} (ref >59)
GFR calc non Af Amer: 61 mL/min/{1.73_m2} (ref >59)
GLUCOSE: 90 mg/dL (ref 65–99)
POTASSIUM: 4.8 mmol/L (ref 3.5–5.2)
SODIUM: 142 mmol/L (ref 134–144)

## 2015-02-16 LAB — HEPATIC FUNCTION PANEL
ALBUMIN: 4.3 g/dL (ref 3.5–4.8)
ALT: 16 IU/L (ref 0–32)
AST: 19 IU/L (ref 0–40)
Alkaline Phosphatase: 61 IU/L (ref 39–117)
Bilirubin Total: 0.4 mg/dL (ref 0.0–1.2)
Bilirubin, Direct: 0.12 mg/dL (ref 0.00–0.40)
Total Protein: 7.2 g/dL (ref 6.0–8.5)

## 2015-02-16 LAB — NMR, LIPOPROFILE
Cholesterol: 223 mg/dL — ABNORMAL HIGH (ref 100–199)
HDL Cholesterol by NMR: 83 mg/dL (ref >39)
HDL Particle Number: 40.6 umol/L (ref >=30.5)
LDL Particle Number: 1170 nmol/L — ABNORMAL HIGH (ref <1000)
LDL Size: 21 nm (ref >20.5)
LDL-C: 119 mg/dL — AB (ref 0–99)
LP-IR SCORE: 47 — AB (ref <=45)
Small LDL Particle Number: 377 nmol/L (ref <=527)
Triglycerides by NMR: 103 mg/dL (ref 0–149)

## 2015-02-16 LAB — VITAMIN D 25 HYDROXY (VIT D DEFICIENCY, FRACTURES): Vit D, 25-Hydroxy: 39.4 ng/mL (ref 30.0–100.0)

## 2015-02-16 LAB — FECAL OCCULT BLOOD, IMMUNOCHEMICAL: Fecal Occult Bld: NEGATIVE

## 2015-02-26 DIAGNOSIS — H2513 Age-related nuclear cataract, bilateral: Secondary | ICD-10-CM | POA: Diagnosis not present

## 2015-02-26 DIAGNOSIS — H40053 Ocular hypertension, bilateral: Secondary | ICD-10-CM | POA: Diagnosis not present

## 2015-02-26 DIAGNOSIS — H40013 Open angle with borderline findings, low risk, bilateral: Secondary | ICD-10-CM | POA: Diagnosis not present

## 2015-02-26 DIAGNOSIS — H18893 Other specified disorders of cornea, bilateral: Secondary | ICD-10-CM | POA: Diagnosis not present

## 2015-03-28 DIAGNOSIS — L570 Actinic keratosis: Secondary | ICD-10-CM | POA: Diagnosis not present

## 2015-03-28 DIAGNOSIS — L821 Other seborrheic keratosis: Secondary | ICD-10-CM | POA: Diagnosis not present

## 2015-03-28 DIAGNOSIS — D1801 Hemangioma of skin and subcutaneous tissue: Secondary | ICD-10-CM | POA: Diagnosis not present

## 2015-03-28 DIAGNOSIS — L814 Other melanin hyperpigmentation: Secondary | ICD-10-CM | POA: Diagnosis not present

## 2015-03-28 DIAGNOSIS — D225 Melanocytic nevi of trunk: Secondary | ICD-10-CM | POA: Diagnosis not present

## 2015-05-02 DIAGNOSIS — N8110 Cystocele, unspecified: Secondary | ICD-10-CM | POA: Diagnosis not present

## 2015-06-23 DIAGNOSIS — Z1231 Encounter for screening mammogram for malignant neoplasm of breast: Secondary | ICD-10-CM | POA: Diagnosis not present

## 2015-06-25 ENCOUNTER — Ambulatory Visit (INDEPENDENT_AMBULATORY_CARE_PROVIDER_SITE_OTHER): Payer: Medicare PPO | Admitting: Family Medicine

## 2015-06-25 ENCOUNTER — Encounter: Payer: Self-pay | Admitting: Family Medicine

## 2015-06-25 VITALS — BP 146/73 | HR 58 | Temp 97.1°F | Ht <= 58 in | Wt 143.0 lb

## 2015-06-25 DIAGNOSIS — N8110 Cystocele, unspecified: Secondary | ICD-10-CM

## 2015-06-25 DIAGNOSIS — E559 Vitamin D deficiency, unspecified: Secondary | ICD-10-CM

## 2015-06-25 DIAGNOSIS — E785 Hyperlipidemia, unspecified: Secondary | ICD-10-CM | POA: Diagnosis not present

## 2015-06-25 DIAGNOSIS — IMO0002 Reserved for concepts with insufficient information to code with codable children: Secondary | ICD-10-CM

## 2015-06-25 DIAGNOSIS — I10 Essential (primary) hypertension: Secondary | ICD-10-CM

## 2015-06-25 NOTE — Progress Notes (Signed)
Subjective:    Patient ID: Maria Camacho, female    DOB: Sep 20, 1933, 81 y.o.   MRN: [id number]  HPI Pt here for follow up and management of chronic medical problems which includes hypertension and hyperlipidemia. She is taking medications regularly. The patient is doing well with no specific complaints. She will get lab work done today and she plans on going and getting her mammogram next week. She has not been checking her blood pressures at home. The patient was considering having bladder repair but we'll delay this until the anatomical situation gets worse according to her urologist. She has a history of renal cell carcinoma and Dr. Nevada Crane has said because it's been 9 years that she does not need to come back anymore and the she has problems. The patient denies chest pain shortness of breath GI symptoms or trouble passing her water. She does occasionally have some burning but this is not often.       Patient Active Problem List   Diagnosis Date Noted  . Cystocele 02/05/2015  . History of renal cell carcinoma 05/16/2014  . Bladder prolapse, female, acquired 05/16/2014  . Vitamin D deficiency 05/16/2014  . Osteopenia 10/10/2013  . hyperlipidemia 03/14/2013  . Hypertension 03/14/2013  . Renal carcinoma 02/06/2013  . Routine gynecological examination 02/06/2013  . Postmenopausal vaginal bleeding 02/06/2013  . Hemorrhoid 02/06/2013   Outpatient Encounter Prescriptions as of 06/25/2015  Medication Sig  . amLODipine-valsartan (EXFORGE) 10-320 MG per tablet TAKE (1) TABLET DAILY AS DIRECTED.  . B Complex-C-E-Zn (BEC/ZINC) TABS Take 1 tablet by mouth daily.  . Cholecalciferol (VITAMIN D) 2000 UNITS tablet Take 2,000 Units by mouth daily.  Marland Kitchen conjugated estrogens (PREMARIN) vaginal cream Place 1 g vaginally daily.  . fish oil-omega-3 fatty acids 1000 MG capsule Take 1 g by mouth daily.  Vladimir Faster Glycol-Propyl Glycol (SYSTANE) 0.4-0.3 % SOLN Apply 1 drop to eye.   No facility-administered  encounter medications on file as of 06/25/2015.      Review of Systems  Constitutional: Negative.   HENT: Negative.   Eyes: Negative.   Respiratory: Negative.   Cardiovascular: Negative.   Gastrointestinal: Negative.   Endocrine: Negative.   Genitourinary: Negative.   Musculoskeletal: Negative.   Skin: Negative.   Allergic/Immunologic: Negative.   Neurological: Negative.   Hematological: Negative.   Psychiatric/Behavioral: Negative.        Objective:   Physical Exam  Constitutional: She is oriented to person, place, and time. She appears well-developed and well-nourished. No distress.  HENT:  Head: Normocephalic and atraumatic.  Right Ear: External ear normal.  Left Ear: External ear normal.  Mouth/Throat: Oropharynx is clear and moist. No oropharyngeal exudate.  Minimal nasal congestion  Eyes: Conjunctivae and EOM are normal. Pupils are equal, round, and reactive to light. Right eye exhibits no discharge. Left eye exhibits no discharge. No scleral icterus.  Neck: Normal range of motion. Neck supple. No thyromegaly present.  Neck without bruits thyromegaly or adenopathy  Cardiovascular: Normal rate, regular rhythm, normal heart sounds and intact distal pulses.   No murmur heard. At 60/m  Pulmonary/Chest: Effort normal and breath sounds normal. No respiratory distress. She has no wheezes. She has no rales. She exhibits no tenderness.  Clear anteriorly and posteriorly  Abdominal: Soft. Bowel sounds are normal. She exhibits no mass. There is no tenderness. There is no rebound and no guarding.  Nontender without masses bruits or organ enlargement  Musculoskeletal: Normal range of motion. She exhibits no edema.  Lymphadenopathy:  She has no cervical adenopathy.  Neurological: She is alert and oriented to person, place, and time. She has normal reflexes. No cranial nerve deficit.  Skin: Skin is warm and dry. No rash noted.  Psychiatric: She has a normal mood and affect. Her  behavior is normal. Judgment and thought content normal.  Nursing note and vitals reviewed.  BP 146/73 mmHg  Pulse 58  Temp(Src) 97.1 F (36.2 C) (Oral)  Ht 4' 10"  (1.473 m)  Wt 143 lb (64.864 kg)  BMI 29.89 kg/m2  Repeat blood pressure 150/80 patient sitting left arm manual cuff      Assessment & Plan:  1. Essential hypertension -The blood pressure was slightly elevated today. The patient says her home readings have been running in the 130s over the 60s. She will bring readings by for review and 3-4 weeks and she will continue to watch her sodium intake - BMP8+EGFR - Hepatic function panel - CBC with Differential/Platelet  2. Vitamin D deficiency -Continue current treatment pending results of lab work - CBC with Differential/Platelet - Vit D  25 hydroxy (rtn osteoporosis monitoring)  3. Hyperlipidemia -The patient is statin intolerant and we'll continue to work with her diet and exercise regimen - CBC with Differential/Platelet - Lipid panel  4. Cystocele -According to patient she has a stage III bladder prolapse and we'll follow-up with the urologist on an as-needed basis, Dr. Estill Dooms  Patient Instructions                       Medicare Annual [facility] and the medical providers at [facility] strive to bring you the best medical care.  In doing so we not only want to address your current medical conditions and concerns but also to detect new conditions early and prevent illness, disease and health-related problems.    Medicare offers a yearly Wellness Visit which allows our clinical staff to assess your need for preventative services including immunizations, lifestyle education, counseling to decrease risk of preventable diseases and screening for fall risk and other medical concerns.    This visit is provided free of charge (no copay) for all Medicare recipients. The clinical pharmacists at [facility] have  begun to conduct these Wellness Visits which will also include a thorough review of all your medications.    As you primary medical provider recommend that you make an appointment for your Annual Wellness Visit if you have not done so already this year.  You may set up this appointment before you leave today or you may call back ([phone]) and schedule an appointment.  Please make sure when you call that you mention that you are scheduling your Annual Wellness Visit with the clinical pharmacist so that the appointment may be made for the proper length of time.     Continue current medications. Continue good therapeutic lifestyle changes which include good diet and exercise. Fall precautions discussed with patient. If an FOBT was given today- please return it to our front desk. If you are over 15 years old - you may need Prevnar 57 or the adult Pneumonia vaccine.  **Flu shots will be available soon--- please call and schedule a FLU-CLINIC appointment**  After your visit with [place] today you will receive a survey in the mail or online from [employer] regarding your care with [place]. Please take a moment to fill this out. Your feedback is very important to [place] as you can help  [place] better understand your patient needs as well as improve your experience and satisfaction. WE CARE ABOUT YOU!!!   **Please join [place] [email or web address], 2016 from 5:00 to 7:00pm for our OPEN HOUSE! Come out and meet our NEW providers** Monitor blood pressures at home and bring readings in for review and 3-4 weeks. Watch sodium intake Stay active and don't put yourself at risk for falling   Arrie Senate MD

## 2015-06-25 NOTE — Patient Instructions (Addendum)
Medicare Annual Wellness Visit  [facility] and the medical providers at [facility] strive to bring you the best medical care.  In doing so we not only want to address your current medical conditions and concerns but also to detect new conditions early and prevent illness, disease and health-related problems.    Medicare offers a yearly Wellness Visit which allows our clinical staff to assess your need for preventative services including immunizations, lifestyle education, counseling to decrease risk of preventable diseases and screening for fall risk and other medical concerns.    This visit is provided free of charge (no copay) for all Medicare recipients. The clinical pharmacists at [facility] have begun to conduct these Wellness Visits which will also include a thorough review of all your medications.    As you primary medical provider recommend that you make an appointment for your Annual Wellness Visit if you have not done so already this year.  You may set up this appointment before you leave today or you may call back ([phone]) and schedule an appointment.  Please make sure when you call that you mention that you are scheduling your Annual Wellness Visit with the clinical pharmacist so that the appointment may be made for the proper length of time.     Continue current medications. Continue good therapeutic lifestyle changes which include good diet and exercise. Fall precautions discussed with patient. If an FOBT was given today- please return it to our front desk. If you are over 55 years old - you may need Prevnar 28 or the adult Pneumonia vaccine.  **Flu shots will be available soon--- please call and schedule a FLU-CLINIC appointment**  After your visit with [place] today you will receive a survey in the mail or online from [employer] regarding your care with [place]. Please take a moment to fill this out. Your feedback is  very important to [place] as you can help [place] better understand your patient needs as well as improve your experience and satisfaction. WE CARE ABOUT YOU!!!   **Please join [place] [email or web address], 2016 from 5:00 to 7:00pm for our OPEN HOUSE! Come out and meet our NEW providers** Monitor blood pressures at home and bring readings in for review and 3-4 weeks. Watch sodium intake Stay active and don't put yourself at risk for falling

## 2015-06-26 ENCOUNTER — Other Ambulatory Visit: Payer: Self-pay | Admitting: Family Medicine

## 2015-06-26 LAB — BMP8+EGFR
BUN/Creatinine Ratio: 18 (ref 11–26)
BUN: 18 mg/dL (ref 8–27)
CHLORIDE: 102 mmol/L (ref 97–108)
CO2: 23 mmol/L (ref 18–29)
Calcium: 9.8 mg/dL (ref 8.7–10.3)
Creatinine, Ser: 0.99 mg/dL (ref 0.57–1.00)
GFR calc Af Amer: 63 mL/min/{1.73_m2} (ref >59)
GFR, EST NON AFRICAN AMERICAN: 54 mL/min/{1.73_m2} — AB (ref >59)
GLUCOSE: 91 mg/dL (ref 65–99)
POTASSIUM: 4.6 mmol/L (ref 3.5–5.2)
SODIUM: 143 mmol/L (ref 134–144)

## 2015-06-26 LAB — LIPID PANEL
CHOL/HDL RATIO: 2.4 ratio (ref 0.0–4.4)
Cholesterol, Total: 213 mg/dL — ABNORMAL HIGH (ref 100–199)
HDL: 89 mg/dL (ref >39)
LDL Calculated: 102 mg/dL — ABNORMAL HIGH (ref 0–99)
TRIGLYCERIDES: 110 mg/dL (ref 0–149)
VLDL CHOLESTEROL CAL: 22 mg/dL (ref 5–40)

## 2015-06-26 LAB — CBC WITH DIFFERENTIAL/PLATELET
BASOS: 1 %
Basophils Absolute: 0 10*3/uL (ref 0.0–0.2)
EOS (ABSOLUTE): 0.2 10*3/uL (ref 0.0–0.4)
Eos: 3 %
Hematocrit: 45.9 % (ref 34.0–46.6)
Hemoglobin: 14.9 g/dL (ref 11.1–15.9)
IMMATURE GRANS (ABS): 0 10*3/uL (ref 0.0–0.1)
Immature Granulocytes: 0 %
LYMPHS: 43 %
Lymphocytes Absolute: 3 10*3/uL (ref 0.7–3.1)
MCH: 30.4 pg (ref 26.6–33.0)
MCHC: 32.5 g/dL (ref 31.5–35.7)
MCV: 94 fL (ref 79–97)
Monocytes Absolute: 0.6 10*3/uL (ref 0.1–0.9)
Monocytes: 8 %
Neutrophils Absolute: 3.2 10*3/uL (ref 1.4–7.0)
Neutrophils: 45 %
Platelets: 239 10*3/uL (ref 150–379)
RBC: 4.9 x10E6/uL (ref 3.77–5.28)
RDW: 13.3 % (ref 12.3–15.4)
WBC: 7 10*3/uL (ref 3.4–10.8)

## 2015-06-26 LAB — HEPATIC FUNCTION PANEL
ALBUMIN: 4.4 g/dL (ref 3.5–4.8)
ALT: 16 IU/L (ref 0–32)
AST: 21 IU/L (ref 0–40)
Alkaline Phosphatase: 53 IU/L (ref 39–117)
Bilirubin Total: 0.4 mg/dL (ref 0.0–1.2)
Bilirubin, Direct: 0.1 mg/dL (ref 0.00–0.40)
Total Protein: 7.2 g/dL (ref 6.0–8.5)

## 2015-06-26 LAB — VITAMIN D 25 HYDROXY (VIT D DEFICIENCY, FRACTURES): Vit D, 25-Hydroxy: 35.9 ng/mL (ref 30.0–100.0)

## 2015-07-01 NOTE — Progress Notes (Signed)
Patient informed of results, voices understanding

## 2015-07-07 ENCOUNTER — Encounter: Payer: Self-pay | Admitting: Family Medicine

## 2015-07-30 ENCOUNTER — Ambulatory Visit (INDEPENDENT_AMBULATORY_CARE_PROVIDER_SITE_OTHER): Payer: Medicare PPO

## 2015-07-30 DIAGNOSIS — Z23 Encounter for immunization: Secondary | ICD-10-CM

## 2015-08-26 DIAGNOSIS — H40053 Ocular hypertension, bilateral: Secondary | ICD-10-CM | POA: Diagnosis not present

## 2015-08-26 DIAGNOSIS — H2513 Age-related nuclear cataract, bilateral: Secondary | ICD-10-CM | POA: Diagnosis not present

## 2015-08-26 DIAGNOSIS — H1045 Other chronic allergic conjunctivitis: Secondary | ICD-10-CM | POA: Diagnosis not present

## 2015-08-26 DIAGNOSIS — H40013 Open angle with borderline findings, low risk, bilateral: Secondary | ICD-10-CM | POA: Diagnosis not present

## 2015-10-10 ENCOUNTER — Ambulatory Visit (INDEPENDENT_AMBULATORY_CARE_PROVIDER_SITE_OTHER): Payer: Medicare PPO | Admitting: Family Medicine

## 2015-10-10 ENCOUNTER — Encounter: Payer: Self-pay | Admitting: Family Medicine

## 2015-10-10 VITALS — BP 136/80 | HR 79 | Temp 98.2°F | Ht <= 58 in | Wt 148.0 lb

## 2015-10-10 DIAGNOSIS — R300 Dysuria: Secondary | ICD-10-CM

## 2015-10-10 DIAGNOSIS — B3731 Acute candidiasis of vulva and vagina: Secondary | ICD-10-CM

## 2015-10-10 DIAGNOSIS — B373 Candidiasis of vulva and vagina: Secondary | ICD-10-CM | POA: Diagnosis not present

## 2015-10-10 DIAGNOSIS — N898 Other specified noninflammatory disorders of vagina: Secondary | ICD-10-CM | POA: Diagnosis not present

## 2015-10-10 LAB — POCT URINALYSIS DIPSTICK
Bilirubin, UA: NEGATIVE
Blood, UA: NEGATIVE
GLUCOSE UA: NEGATIVE
Ketones, UA: NEGATIVE
LEUKOCYTES UA: NEGATIVE
NITRITE UA: NEGATIVE
PROTEIN UA: NEGATIVE
SPEC GRAV UA: 1.025
UROBILINOGEN UA: NEGATIVE
pH, UA: 6

## 2015-10-10 LAB — POCT WET PREP (WET MOUNT): KOH Wet Prep POC: POSITIVE

## 2015-10-10 LAB — POCT UA - MICROSCOPIC ONLY
Casts, Ur, LPF, POC: NEGATIVE
Crystals, Ur, HPF, POC: NEGATIVE
Epithelial cells, urine per micros: NEGATIVE
WBC, UR, HPF, POC: NEGATIVE

## 2015-10-10 MED ORDER — FLUCONAZOLE 200 MG PO TABS: 200.0000 mg | ORAL_TABLET | Freq: Every day | ORAL | Status: DC

## 2015-10-10 NOTE — Patient Instructions (Addendum)
use Monistat cream twice daily topically and intravaginally for one week  take Diflucan daily for 3 days

## 2015-10-10 NOTE — Progress Notes (Signed)
   Subjective:    Patient ID: Maria Camacho, female    DOB: [birth year], 81 y.o.   MRN: [id number]  HPI Patient here today for vaginal irritation / redness and dysuria. She also complains of left lower leg enlargement.  The patient complains of some redness an irritation and burning when she passes her water. She will get a urine specimen and a wet prep      Patient Active Problem List   Diagnosis Date Noted  . Cystocele 02/05/2015  . History of renal cell carcinoma 05/16/2014  . Bladder prolapse, female, acquired 05/16/2014  . Vitamin D deficiency 05/16/2014  . Osteopenia 10/10/2013  . hyperlipidemia 03/14/2013  . Hypertension 03/14/2013  . Renal carcinoma ([facility]) 02/06/2013  . Routine gynecological examination 02/06/2013  . Postmenopausal vaginal bleeding 02/06/2013  . Hemorrhoid 02/06/2013   Outpatient Encounter Prescriptions as of 10/10/2015  Medication Sig  . amLODipine-valsartan (EXFORGE) 10-320 MG per tablet TAKE (1) TABLET DAILY AS DIRECTED.  . B Complex-C-E-Zn (BEC/ZINC) TABS Take 1 tablet by mouth daily.  . Cholecalciferol (VITAMIN D) 2000 UNITS tablet Take 2,000 Units by mouth daily.  Marland Kitchen conjugated estrogens (PREMARIN) vaginal cream Place 1 g vaginally daily.  . fish oil-omega-3 fatty acids 1000 MG capsule Take 1 g by mouth daily.  Vladimir Faster Glycol-Propyl Glycol (SYSTANE) 0.4-0.3 % SOLN Apply 1 drop to eye.   No facility-administered encounter medications on file as of 10/10/2015.     Review of Systems  Constitutional: Negative.   HENT: Negative.   Eyes: Negative.   Respiratory: Negative.   Cardiovascular: Negative.   Gastrointestinal: Negative.   Endocrine: Negative.   Genitourinary: Positive for dysuria and vaginal pain (redness and irritation).  Musculoskeletal: Negative.        Left lower leg larger  Skin: Negative.   Allergic/Immunologic: Negative.   Neurological: Negative.   Hematological: Negative.   Psychiatric/Behavioral: Negative.          Objective:   Physical Exam BP 136/80 mmHg  Pulse 79  Temp(Src) 98.2 F (36.8 C) (Oral)  Ht 4\' 10"  (1.473 m)  Wt 148 lb (67.132 kg)  BMI 30.94 kg/m2  vaginal erythema and perineal erythema back to the rectal area. The patient has a cystocele and this may be contributing some to the irritation.       Assessment & Plan:  1. Vaginal irritation - the wet prep was positive for yeast and clue cells. - POCT Wet Prep [employer])  2. Dysuria - the urinalysis was clear. - POCT urinalysis dipstick - POCT UA - Microscopic Only - Urine culture  3. Yeast vaginitis -  Monistat vaginal cream twice daily and Diflucan 200 one daily for 3 days  Patient Instructions  use Monistat cream twice daily topically and intravaginally for one week  take Diflucan daily for 3 days   Arrie Senate MD

## 2015-10-12 LAB — URINE CULTURE

## 2015-10-23 ENCOUNTER — Encounter: Payer: Self-pay | Admitting: Family

## 2015-10-23 ENCOUNTER — Ambulatory Visit (INDEPENDENT_AMBULATORY_CARE_PROVIDER_SITE_OTHER): Payer: Medicare PPO | Admitting: Family

## 2015-10-23 VITALS — BP 135/71 | HR 64 | Temp 96.9°F | Ht <= 58 in | Wt 148.0 lb

## 2015-10-23 DIAGNOSIS — A499 Bacterial infection, unspecified: Secondary | ICD-10-CM

## 2015-10-23 DIAGNOSIS — B379 Candidiasis, unspecified: Secondary | ICD-10-CM

## 2015-10-23 DIAGNOSIS — N760 Acute vaginitis: Secondary | ICD-10-CM

## 2015-10-23 DIAGNOSIS — B9689 Other specified bacterial agents as the cause of diseases classified elsewhere: Secondary | ICD-10-CM

## 2015-10-23 LAB — POCT WET PREP (WET MOUNT): KOH Wet Prep POC: POSITIVE

## 2015-10-23 MED ORDER — FLUCONAZOLE 150 MG PO TABS: 150.0000 mg | ORAL_TABLET | ORAL | Status: DC

## 2015-10-23 MED ORDER — METRONIDAZOLE 500 MG PO TABS: 500.0000 mg | ORAL_TABLET | Freq: Two times a day (BID) | ORAL | Status: DC

## 2015-10-23 NOTE — Progress Notes (Signed)
   Subjective:    Patient ID: Maria Camacho, female    DOB: 1933/02/06, 81 y.o.   MRN: [id number]  Pt presents to the office today to recheck vaginal yeast infection. Pt was treated with diflucan for 3 days, Monistat vaginal cream BID.  Vaginal Itching The patient's primary symptoms include genital itching. The patient's pertinent negatives include no genital odor, genital rash or vaginal discharge. This is a recurrent problem. The problem occurs intermittently. The problem has been waxing and waning. Associated symptoms include dysuria. Pertinent negatives include no constipation, diarrhea, discolored urine, headaches, hematuria or sore throat.      Review of Systems  Constitutional: Negative.   HENT: Negative.  Negative for sore throat.   Eyes: Negative.   Respiratory: Negative.  Negative for shortness of breath.   Cardiovascular: Negative.  Negative for palpitations.  Gastrointestinal: Negative.  Negative for diarrhea and constipation.  Endocrine: Negative.   Genitourinary: Positive for dysuria. Negative for hematuria and vaginal discharge.  Musculoskeletal: Negative.   Neurological: Negative.  Negative for headaches.  Hematological: Negative.   Psychiatric/Behavioral: Negative.   All other systems reviewed and are negative.      Objective:   Physical Exam  Constitutional: She is oriented to person, place, and time. She appears well-developed and well-nourished. No distress.  HENT:  Head: Normocephalic and atraumatic.  Eyes: Pupils are equal, round, and reactive to light.  Neck: Normal range of motion. Neck supple. No thyromegaly present.  Cardiovascular: Normal rate, regular rhythm, normal heart sounds and intact distal pulses.   No murmur heard. Pulmonary/Chest: Effort normal and breath sounds normal. No respiratory distress. She has no wheezes.  Abdominal: Soft. Bowel sounds are normal. She exhibits no distension. There is no tenderness.  Musculoskeletal: Normal range of  motion. She exhibits no edema or tenderness.  Neurological: She is alert and oriented to person, place, and time. She has normal reflexes. No cranial nerve deficit.  Skin: Skin is warm and dry.  Psychiatric: She has a normal mood and affect. Her behavior is normal. Judgment and thought content normal.  Vitals reviewed.   BP 135/71 mmHg  Pulse 64  Temp(Src) 96.9 F (36.1 C) (Oral)  Ht 4\' 10"  (1.473 m)  Wt 148 lb (67.132 kg)  BMI 30.94 kg/m2       Assessment & Plan:  1. Yeast infection -Keep clean and dry -Cotton underwear -Continue with Monistat cream BID - POCT Wet Prep ([place]et [place]) - fluconazole (DIFLUCAN) 150 MG tablet; Take 1 tablet (150 mg total) by mouth every 3 (three) days.  Dispense: 3 tablet; Refill: 0  2. BV (bacterial vaginosis) Keep clean and dry - metroNIDAZOLE (FLAGYL) 500 MG tablet; Take 1 tablet (500 mg total) by mouth 2 (two) times daily.  Dispense: 14 tablet; Refill: 0  Evelina Dun, FNP

## 2015-10-23 NOTE — Patient Instructions (Signed)

## 2015-11-26 ENCOUNTER — Encounter: Payer: Self-pay | Admitting: Family Medicine

## 2015-11-26 ENCOUNTER — Ambulatory Visit (INDEPENDENT_AMBULATORY_CARE_PROVIDER_SITE_OTHER): Payer: Medicare Other | Admitting: Family Medicine

## 2015-11-26 ENCOUNTER — Ambulatory Visit (INDEPENDENT_AMBULATORY_CARE_PROVIDER_SITE_OTHER): Payer: Medicare Other

## 2015-11-26 VITALS — BP 125/72 | HR 67 | Temp 97.3°F | Ht <= 58 in | Wt 146.0 lb

## 2015-11-26 DIAGNOSIS — E785 Hyperlipidemia, unspecified: Secondary | ICD-10-CM | POA: Diagnosis not present

## 2015-11-26 DIAGNOSIS — R5383 Other fatigue: Secondary | ICD-10-CM

## 2015-11-26 DIAGNOSIS — E559 Vitamin D deficiency, unspecified: Secondary | ICD-10-CM

## 2015-11-26 DIAGNOSIS — M8580 Other specified disorders of bone density and structure, unspecified site: Secondary | ICD-10-CM

## 2015-11-26 DIAGNOSIS — C649 Malignant neoplasm of unspecified kidney, except renal pelvis: Secondary | ICD-10-CM

## 2015-11-26 DIAGNOSIS — I10 Essential (primary) hypertension: Secondary | ICD-10-CM

## 2015-11-26 DIAGNOSIS — Z780 Asymptomatic menopausal state: Secondary | ICD-10-CM

## 2015-11-26 DIAGNOSIS — Z85528 Personal history of other malignant neoplasm of kidney: Secondary | ICD-10-CM

## 2015-11-26 NOTE — Progress Notes (Signed)
Subjective:    Patient ID: Maria Camacho, female    DOB: 17-Apr-1933, 82 y.o.   MRN: [id number]  HPI Pt here for follow up and management of chronic medical problems which includes hypertension and hyperlipidemia. She is taking medications regularly. The patient is doing well overall and her biggest complaint is just a lack of energy. She will be scheduled for her pelvic exam with one of the mid levels here. She is due to get a DEXA scan and this will be done today hopefully. She will get lab work today. The patient does complain of some fatigue but this is been going on for only a couple of days. She exercises regularly. She says that when she eats fast that she has some discomfort in her swallowing tube but otherwise does not have any problems with that. This is no worse than usual. She denies chest pain or shortness of breath. She denies heartburn indigestion nausea vomiting diarrhea or blood in the stool or black tarry bowel movements. She is passing her water without problems and as mentioned earlier is followed regularly by the urologist because of her bladder prolapse. She's planning a trip to [place] with her granddaughter and is excited about this. She brings in home blood pressures for review and all of these are good.     Patient Active Problem List   Diagnosis Date Noted  . Cystocele 02/05/2015  . History of renal cell carcinoma 05/16/2014  . Bladder prolapse, female, acquired 05/16/2014  . Vitamin D deficiency 05/16/2014  . Osteopenia 10/10/2013  . hyperlipidemia 03/14/2013  . Hypertension 03/14/2013  . Renal carcinoma ([place]) 02/06/2013  . Routine gynecological examination 02/06/2013  . Postmenopausal vaginal bleeding 02/06/2013  . Hemorrhoid 02/06/2013   Outpatient Encounter Prescriptions as of 11/26/2015  Medication Sig  . acetaminophen (TYLENOL) 500 MG tablet Take 500 mg by mouth.  Marland Kitchen amLODipine-valsartan (EXFORGE) 10-320 MG per tablet TAKE (1) TABLET DAILY AS DIRECTED.  . B  Complex-C-E-Zn (BEC/ZINC) TABS Take 4 tablets by mouth daily.   . Cholecalciferol (VITAMIN D) 2000 UNITS tablet Take 2,000 Units by mouth daily.  Marland Kitchen conjugated estrogens (PREMARIN) vaginal cream Place 1 g vaginally daily.  . fish oil-omega-3 fatty acids 1000 MG capsule Take 1 g by mouth daily.  Vladimir Faster Glycol-Propyl Glycol (SYSTANE) 0.4-0.3 % SOLN Apply 1 drop to eye.  . [DISCONTINUED] fluconazole (DIFLUCAN) 150 MG tablet Take 1 tablet (150 mg total) by mouth every 3 (three) days.  . [DISCONTINUED] metroNIDAZOLE (FLAGYL) 500 MG tablet Take 1 tablet (500 mg total) by mouth 2 (two) times daily.   No facility-administered encounter medications on file as of 11/26/2015.      Review of Systems  Constitutional: Positive for fatigue.  HENT: Negative.   Eyes: Negative.   Respiratory: Negative.   Cardiovascular: Negative.   Gastrointestinal: Negative.   Endocrine: Negative.   Genitourinary: Negative.   Musculoskeletal: Negative.   Skin: Negative.   Allergic/Immunologic: Negative.   Neurological: Negative.   Hematological: Negative.   Psychiatric/Behavioral: Negative.        Objective:   Physical Exam  Constitutional: She is oriented to person, place, and time. She appears well-developed and well-nourished. No distress.  The patient is alert and looks much younger than her stated age of 33 years.  HENT:  Head: Normocephalic and atraumatic.  Right Ear: External ear normal.  Left Ear: External ear normal.  Mouth/Throat: Oropharynx is clear and moist. No oropharyngeal exudate.  Slight nasal irritation and congestion bilaterally  Eyes: Conjunctivae and EOM are normal. Pupils are equal, round, and reactive to light. Right eye exhibits no discharge. Left eye exhibits no discharge. No scleral icterus.  Neck: Normal range of motion. Neck supple. No thyromegaly present.  No bruits or anterior cervical adenopathy or thyromegaly  Cardiovascular: Normal rate, regular rhythm, normal heart sounds  and intact distal pulses.  Exam reveals no gallop and no friction rub.   No murmur heard. Heart has a regular rate and rhythm without murmur  Pulmonary/Chest: Effort normal and breath sounds normal. No respiratory distress. She has no wheezes. She has no rales.  Clear anteriorly and posteriorly  Abdominal: Soft. Bowel sounds are normal. She exhibits no mass. There is no tenderness. There is no rebound and no guarding.  The abdomen is soft without spleen or liver enlargement or masses or bruits  Musculoskeletal: Normal range of motion. She exhibits no edema or tenderness.  Lymphadenopathy:    She has no cervical adenopathy.  Neurological: She is alert and oriented to person, place, and time. She has normal reflexes. No cranial nerve deficit.  Skin: Skin is warm and dry. No rash noted.  Psychiatric: She has a normal mood and affect. Her behavior is normal. Judgment and thought content normal.  Nursing note and vitals reviewed.   BP 125/72 mmHg  Pulse 67  Temp(Src) 97.3 F (36.3 C) (Oral)  Ht 4' 10"  (1.473 m)  Wt 146 lb (66.225 kg)  BMI 30.52 kg/m2       Assessment & Plan:  1. Essential hypertension -Continue with current treatment pending results of lab work - BMP8+EGFR - CBC with Differential/Platelet - Hepatic function panel  2. Hyperlipidemia -Continue with omega-3 fatty acids and diet and exercise - CBC with Differential/Platelet - NMR, lipoprofile  3. Vitamin D deficiency -Continue current treatment pending results of lab work - CBC with Differential/Platelet - VITAMIN D 25 Hydroxy (Vit-D Deficiency, Fractures) - DG Bone Density; Future  4. Postmenopausal -The patient be scheduled for a pelvic exam soon - CBC with Differential/Platelet - DG Bone Density; Future  5. Osteopenia -She will get a DEXA scan also soon to follow-up on her osteopenia - CBC with Differential/Platelet - DG Bone Density; Future  6. History of renal cell carcinoma -Continue to follow-up  with urology - BMP8+EGFR - CBC with Differential/Platelet  7. Other fatigue -Check lab work and make sure there is no reason for the fatigue - CBC with Differential/Platelet - Thyroid Panel With TSH  8. Renal carcinoma, unspecified laterality ([place]) -Continue follow-up with urology  Patient Instructions                       Medicare Annual Wellness Visit  [facility] and the medical providers at [facility] strive to bring you the best medical care.  In doing so we not only want to address your current medical conditions and concerns but also to detect new conditions early and prevent illness, disease and health-related problems.    Medicare offers a yearly Wellness Visit which allows our clinical staff to assess your need for preventative services including immunizations, lifestyle education, counseling to decrease risk of preventable diseases and screening for fall risk and other medical concerns.    This visit is provided free of charge (no copay) for all Medicare recipients. The clinical pharmacists at [facility] have begun to conduct these Wellness Visits which will also include a thorough review of all your medications.    As  you primary medical provider recommend that you make an appointment for your Annual Wellness Visit if you have not done so already this year.  You may set up this appointment before you leave today or you may call back ([phone]) and schedule an appointment.  Please make sure when you call that you mention that you are scheduling your Annual Wellness Visit with the clinical pharmacist so that the appointment may be made for the proper length of time.     Continue current medications. Continue good therapeutic lifestyle changes which include good diet and exercise. Fall precautions discussed with patient. If an FOBT was given today- please return it to our front desk. If you are over 49 years old - you may need  Prevnar 5 or the adult Pneumonia vaccine.  **Flu shots are available--- please call and schedule a FLU-CLINIC appointment**  After your visit with [place] today you will receive a survey in the mail or online from [employer] regarding your care with [place]. Please take a moment to fill this out. Your feedback is very important to [place] as you can help [place] better understand your patient needs as well as improve your experience and satisfaction. WE CARE ABOUT YOU!!!   The patient should continue healthy living activities like exercise and diet habits and staying involved in traveling as much as possible She should follow-up with her urologist on her bladder prolapse and her renal cell carcinoma which was in the right side. We will call her with her DEXA results as soon as possible We will call with lab work results when they become available   Arrie Senate MD

## 2015-11-26 NOTE — Patient Instructions (Addendum)
Medicare Annual Wellness Visit  [facility] and the medical providers at [facility] strive to bring you the best medical care.  In doing so we not only want to address your current medical conditions and concerns but also to detect new conditions early and prevent illness, disease and health-related problems.    Medicare offers a yearly Wellness Visit which allows our clinical staff to assess your need for preventative services including immunizations, lifestyle education, counseling to decrease risk of preventable diseases and screening for fall risk and other medical concerns.    This visit is provided free of charge (no copay) for all Medicare recipients. The clinical pharmacists at [facility] have begun to conduct these Wellness Visits which will also include a thorough review of all your medications.    As you primary medical provider recommend that you make an appointment for your Annual Wellness Visit if you have not done so already this year.  You may set up this appointment before you leave today or you may call back [phone] and schedule an appointment.  Please make sure when you call that you mention that you are scheduling your Annual Wellness Visit with the clinical pharmacist so that the appointment may be made for the proper length of time.     Continue current medications. Continue good therapeutic lifestyle changes which include good diet and exercise. Fall precautions discussed with patient. If an FOBT was given today- please return it to our front desk. If you are over 26 years old - you may need Prevnar 57 or the adult Pneumonia vaccine.  **Flu shots are available--- please call and schedule a FLU-CLINIC appointment**  After your visit with [place] today you will receive a survey in the mail or online from [employer] regarding your care with [place]. Please take a moment to fill this out. Your feedback is very  important to [place] as you can help [place] better understand your patient needs as well as improve your experience and satisfaction. WE CARE ABOUT YOU!!!   The patient should continue healthy living activities like exercise and diet habits and staying involved in traveling as much as possible She should follow-up with her urologist on her bladder prolapse and her renal cell carcinoma which was in the right side. We will call her with her DEXA results as soon as possible We will call with lab work results when they become available

## 2015-11-27 ENCOUNTER — Ambulatory Visit (INDEPENDENT_AMBULATORY_CARE_PROVIDER_SITE_OTHER): Payer: Medicare Other | Admitting: Family Medicine

## 2015-11-27 ENCOUNTER — Encounter: Payer: Self-pay | Admitting: Family Medicine

## 2015-11-27 VITALS — BP 125/68 | HR 73 | Temp 97.7°F | Ht <= 58 in | Wt 149.2 lb

## 2015-11-27 DIAGNOSIS — K148 Other diseases of tongue: Secondary | ICD-10-CM

## 2015-11-27 LAB — CBC WITH DIFFERENTIAL/PLATELET
Basophils Absolute: 0 10*3/uL (ref 0.0–0.2)
Basos: 1 %
EOS (ABSOLUTE): 0.2 10*3/uL (ref 0.0–0.4)
EOS: 2 %
HEMATOCRIT: 45.1 % (ref 34.0–46.6)
HEMOGLOBIN: 14.9 g/dL (ref 11.1–15.9)
Immature Grans (Abs): 0 10*3/uL (ref 0.0–0.1)
Immature Granulocytes: 0 %
LYMPHS ABS: 3.7 10*3/uL — AB (ref 0.7–3.1)
Lymphs: 44 %
MCH: 30.6 pg (ref 26.6–33.0)
MCHC: 33 g/dL (ref 31.5–35.7)
MCV: 93 fL (ref 79–97)
MONOCYTES: 8 %
Monocytes Absolute: 0.7 10*3/uL (ref 0.1–0.9)
NEUTROS ABS: 3.8 10*3/uL (ref 1.4–7.0)
Neutrophils: 45 %
Platelets: 263 10*3/uL (ref 150–379)
RBC: 4.87 x10E6/uL (ref 3.77–5.28)
RDW: 13.8 % (ref 12.3–15.4)
WBC: 8.4 10*3/uL (ref 3.4–10.8)

## 2015-11-27 LAB — NMR, LIPOPROFILE
Cholesterol: 232 mg/dL — ABNORMAL HIGH (ref 100–199)
HDL Cholesterol by NMR: 85 mg/dL (ref >39)
HDL PARTICLE NUMBER: 43.2 umol/L (ref >=30.5)
LDL PARTICLE NUMBER: 1242 nmol/L — AB (ref <1000)
LDL Size: 21.2 nm (ref >20.5)
LDL-C: 126 mg/dL — AB (ref 0–99)
Small LDL Particle Number: 494 nmol/L (ref <=527)
Triglycerides by NMR: 106 mg/dL (ref 0–149)

## 2015-11-27 LAB — THYROID PANEL WITH TSH
FREE THYROXINE INDEX: 2.2 (ref 1.2–4.9)
T3 UPTAKE RATIO: 29 % (ref 24–39)
T4, Total: 7.6 ug/dL (ref 4.5–12.0)
TSH: 4.68 u[IU]/mL — AB (ref 0.450–4.500)

## 2015-11-27 LAB — HEPATIC FUNCTION PANEL
ALK PHOS: 57 IU/L (ref 39–117)
ALT: 18 IU/L (ref 0–32)
AST: 23 IU/L (ref 0–40)
Albumin: 4.6 g/dL (ref 3.5–4.7)
Bilirubin Total: 0.4 mg/dL (ref 0.0–1.2)
Bilirubin, Direct: 0.11 mg/dL (ref 0.00–0.40)
Total Protein: 7.6 g/dL (ref 6.0–8.5)

## 2015-11-27 LAB — BMP8+EGFR
BUN/Creatinine Ratio: 11 (ref 11–26)
BUN: 10 mg/dL (ref 8–27)
CHLORIDE: 103 mmol/L (ref 96–106)
CO2: 23 mmol/L (ref 18–29)
CREATININE: 0.9 mg/dL (ref 0.57–1.00)
Calcium: 9.7 mg/dL (ref 8.7–10.3)
GFR calc non Af Amer: 61 mL/min/{1.73_m2} (ref >59)
GFR, EST AFRICAN AMERICAN: 70 mL/min/{1.73_m2} (ref >59)
Glucose: 96 mg/dL (ref 65–99)
Potassium: 5 mmol/L (ref 3.5–5.2)
SODIUM: 145 mmol/L — AB (ref 134–144)

## 2015-11-27 LAB — VITAMIN D 25 HYDROXY (VIT D DEFICIENCY, FRACTURES): Vit D, 25-Hydroxy: 36.3 ng/mL (ref 30.0–100.0)

## 2015-11-27 NOTE — Patient Instructions (Signed)
Great to meet you!  I think that your skin lesion on your tongue will pass in the next few eeks. If it rapidly gets larger, starts to ooze or drain, or begins to hurt please come back.   For now consider salt water rinses 2-3 times a day for 3-4 days.   I think the likelihood of cancer is low, but it is possible so it is good that we have seen it and will keep track of it.

## 2015-11-27 NOTE — Progress Notes (Signed)
   HPI  Patient presents today for a time lesion.  Patient explains that for the last 2 weeks or so she has had the feeling of something under her tongue. Today she got a mirror and examined it finding a white bump. She had a friend look at it who stated that it was not a big deal,, however another friend had a cancer in her mouth that began a similar way.  States that it is not painful Is only aware of it due to the feeling of fullness under her tongue. She denies any fevers, chills, sweats.  PMH: Smoking status noted ROS: Per HPI  Objective: BP 125/68 mmHg  Pulse 73  Temp(Src) 97.7 F (36.5 C) (Oral)  Ht 4\' 10"  (1.473 m)  Wt 149 lb 3.2 oz (67.677 kg)  BMI 31.19 kg/m2 Gen: NAD, alert, cooperative with exam HEENT: NCAT, small approximately 1 mm x 3 mm white bump along the longitudinal axis of her time located on the right side of the underside of her tongue, it is nontender to palpation and does not have any surrounding fluctuance Ext: No edema, warm Neuro: Alert and oriented, No gross deficits  Assessment and plan:  # 10 recent Unclear etiology Recommended saltwater rinses 2-3 times daily 4 days. Follow-up if persistent, changes rapidly, becomes tender, or begins oozing. I think it's low likelihood for cancer, however I have stressed the importance of following a long-term if it persists.   Maria Apple, MD [facility] 11/27/2015, 3:50 PM

## 2015-12-29 ENCOUNTER — Encounter: Payer: Self-pay | Admitting: Pharmacist

## 2015-12-29 ENCOUNTER — Ambulatory Visit (INDEPENDENT_AMBULATORY_CARE_PROVIDER_SITE_OTHER): Payer: Medicare Other | Admitting: Pharmacist

## 2015-12-29 VITALS — BP 139/74 | HR 68 | Ht <= 58 in | Wt 149.0 lb

## 2015-12-29 DIAGNOSIS — Z0000 Encounter for general adult medical examination without abnormal findings: Secondary | ICD-10-CM

## 2015-12-29 DIAGNOSIS — M8580 Other specified disorders of bone density and structure, unspecified site: Secondary | ICD-10-CM

## 2015-12-29 NOTE — Patient Instructions (Signed)
Maria Camacho , Thank you for taking time to come for your Medicare Wellness Visit. I appreciate your ongoing commitment to your health goals. Please review the following plan we discussed and let me know if I can assist you in the future.   These are the goals we discussed: Increase calcium in diet - 2 - 8oz. Glasses of almond milk; 1 yogurt and green leafy vegetables  Continue to exercise daily   This is a list of the screening recommended for you and due dates:  Health Maintenance  Topic Date Due  . Flu Shot  05/25/2016  . Mammogram  06/22/2017  . DEXA scan (bone density measurement)  11/25/2017  . Tetanus Vaccine  02/22/2021  . Shingles Vaccine  Completed  . Pneumonia vaccines  Completed                 Exercise for Strong Bones  Exercise is important to build and maintain strong bones / bone density.  There are 2 types of exercises that are important to building and maintaining strong bones:  Weight- bearing and muscle-stregthening.  Weight-bearing Exercises  These exercises include activities that make you move against gravity while staying upright. Weight-bearing exercises can be high-impact or low-impact.  High-impact weight-bearing exercises help build bones and keep them strong. If you have broken a bone due to osteoporosis or are at risk of breaking a bone, you may need to avoid high-impact exercises. If you're not sure, you should check with your healthcare provider.  Examples of high-impact weight-bearing exercises are: Dancing  Doing high-impact aerobics  Hiking  Jogging/running  Jumping Rope  Stair climbing  Tennis  Low-impact weight-bearing exercises can also help keep bones strong and are a safe alternative if you cannot do high-impact exercises.   Examples of low-impact weight-bearing exercises are: Using elliptical training machines  Doing low-impact aerobics  Using stair-step machines  Fast walking on a treadmill or outside   Muscle-Strengthening  Exercises These exercises include activities where you move your body, a weight or some other resistance against gravity. They are also known as resistance exercises and include: Lifting weights  Using elastic exercise bands  Using weight machines  Lifting your own body weight  Functional movements, such as standing and rising up on your toes  Yoga and Pilates can also improve strength, balance and flexibility. However, certain positions may not be safe for people with osteoporosis or those at increased risk of broken bones. For example, exercises that have you bend forward may increase the chance of breaking a bone in the spine.   Non-Impact Exercises There are other types of exercises that can help prevent falls.  Non-impact exercises can help you to improve balance, posture and how well you move in everyday activities. Some of these exercises include: Balance exercises that strengthen your legs and test your balance, such as Tai Chi, can decrease your risk of falls.  Posture exercises that improve your posture and reduce rounded or "sloping" shoulders can help you decrease the chance of breaking a bone, especially in the spine.  Functional exercises that improve how well you move can help you with everyday activities and decrease your chance of falling and breaking a bone. For example, if you have trouble getting up from a chair or climbing stairs, you should do these activities as exercises.   **A physical therapist can teach you balance, posture and functional exercises. He/she can also help you learn which exercises are safe and appropriate for you.  [facility] has a physical therapy office in [place] in front of our office and referrals can be made for assessments and treatment as needed and strength and balance training.  If you would like to have an assessment with [place] and our physical therapy team please let a nurse or provider know.   Fall Prevention in the Home  Falls can cause  injuries and can affect people from all age groups. There are many simple things that you can do to make your home safe and to help prevent falls. WHAT CAN I DO ON THE OUTSIDE OF MY HOME?  Regularly repair the edges of walkways and driveways and fix any cracks.  Remove high doorway thresholds.  Trim any shrubbery on the main path into your home.  Use bright outdoor lighting.  Clear walkways of debris and clutter, including tools and rocks.  Regularly check that handrails are securely fastened and in good repair. Both sides of any steps should have handrails.  Install guardrails along the edges of any raised decks or porches.  Have leaves, snow, and ice cleared regularly.  Use sand or salt on walkways during winter months.  In the garage, clean up any spills right away, including grease or oil spills. WHAT CAN I DO IN THE BATHROOM?  Use night lights.  Install grab bars by the toilet and in the tub and shower. Do not use towel bars as grab bars.  Use non-skid mats or decals on the floor of the tub or shower.  If you need to sit down while you are in the shower, use a plastic, non-slip stool.Marland Kitchen  Keep the floor dry. Immediately clean up any water that spills on the floor.  Remove soap buildup in the tub or shower on a regular basis.  Attach bath mats securely with double-sided non-slip rug tape.  Remove throw rugs and other tripping hazards from the floor. WHAT CAN I DO IN THE BEDROOM?  Use night lights.  Make sure that a bedside light is easy to reach.  Do not use oversized bedding that drapes onto the floor.  Have a firm chair that has side arms to use for getting dressed.  Remove throw rugs and other tripping hazards from the floor. WHAT CAN I DO IN THE KITCHEN?   Clean up any spills right away.  Avoid walking on wet floors.  Place frequently used items in easy-to-reach places.  If you need to reach for something above you, use a sturdy step stool that has a  grab bar.  Keep electrical cables out of the way.  Do not use floor polish or wax that makes floors slippery. If you have to use wax, make sure that it is non-skid floor wax.  Remove throw rugs and other tripping hazards from the floor. WHAT CAN I DO IN THE STAIRWAYS?  Do not leave any items on the stairs.  Make sure that there are handrails on both sides of the stairs. Fix handrails that are broken or loose. Make sure that handrails are as long as the stairways.  Check any carpeting to make sure that it is firmly attached to the stairs. Fix any carpet that is loose or worn.  Avoid having throw rugs at the top or bottom of stairways, or secure the rugs with carpet tape to prevent them from moving.  Make sure that you have a light switch at the top of the stairs and the bottom of the stairs. If you do not have them, have them  installed. WHAT ARE SOME OTHER FALL PREVENTION TIPS?  Wear closed-toe shoes that fit well and support your feet. Wear shoes that have rubber soles or low heels.  When you use a stepladder, make sure that it is completely opened and that the sides are firmly locked. Have someone hold the ladder while you are using it. Do not climb a closed stepladder.  Add color or contrast paint or tape to grab bars and handrails in your home. Place contrasting color strips on the first and last steps.  Use mobility aids as needed, such as canes, walkers, scooters, and crutches.  Turn on lights if it is dark. Replace any light bulbs that burn out.  Set up furniture so that there are clear paths. Keep the furniture in the same spot.  Fix any uneven floor surfaces.  Choose a carpet design that does not hide the edge of steps of a stairway.  Be aware of any and all pets.  Review your medicines with your healthcare provider. Some medicines can cause dizziness or changes in blood pressure, which increase your risk of falling. Talk with your health care provider about other ways  that you can decrease your risk of falls. This may include working with a physical therapist or trainer to improve your strength, balance, and endurance.   This information is not intended to replace advice given to you by your health care provider. Make sure you discuss any questions you have with your health care provider.   Document Released: 10/01/2002 Document Revised: 02/25/2015 Document Reviewed: 11/15/2014 Elsevier Interactive Patient Education [employer].
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
## 2016-01-13 ENCOUNTER — Encounter: Payer: Self-pay | Admitting: *Deleted

## 2016-01-16 ENCOUNTER — Encounter: Payer: Self-pay | Admitting: Family

## 2016-01-16 ENCOUNTER — Ambulatory Visit (INDEPENDENT_AMBULATORY_CARE_PROVIDER_SITE_OTHER): Payer: Medicare Other | Admitting: Family

## 2016-01-16 VITALS — BP 138/78 | HR 71 | Temp 97.1°F | Ht <= 58 in | Wt 148.2 lb

## 2016-01-16 DIAGNOSIS — K649 Unspecified hemorrhoids: Secondary | ICD-10-CM | POA: Diagnosis not present

## 2016-01-16 MED ORDER — HYDROCORTISONE ACETATE 25 MG RE SUPP: 25.0000 mg | Freq: Two times a day (BID) | RECTAL | Status: DC

## 2016-01-16 NOTE — Patient Instructions (Signed)
Nonsurgical Procedures for Hemorrhoids °Nonsurgical procedures can be used to treat hemorrhoids. Hemorrhoids are swollen veins that are inside the rectum (internal hemorrhoids) or around the anus (external hemorrhoids). They are caused by increased pressure in the anal area. This pressure may result from straining to have a bowel movement (constipation), diarrhea, pregnancy, obesity, anal sex, or sitting for long periods of time. °Hemorrhoids can cause symptoms such as pain and bleeding. Various procedures may be performed if diet changes, lifestyle changes, and other treatments do not help your symptoms. Some of these procedures do not involve surgery. Three common nonsurgical procedures are: °· Rubber band ligation. Rubber bands are used to cut off the blood supply to the hemorrhoids. °· Sclerotherapy. Medicine is injected into the hemorrhoids to shrink them. °· Infrared coagulation. A type of light energy is used to get rid of the hemorrhoids. °LET YOUR HEALTH CARE PROVIDER KNOW ABOUT: °· Any allergies you have. °· All medicines you are taking, including vitamins, herbs, eye drops, creams, and over-the-counter medicines. °· Previous problems you or members of your family have had with the use of anesthetics. °· Any blood disorders you have. °· Previous surgeries you have had. °· Any medical conditions you have. °· Whether you are pregnant or may be pregnant. °RISKS AND COMPLICATIONS °Generally, this is a safe procedure. However, problems may occur, including: °· Infection. °· Bleeding. °· Pain. °BEFORE THE PROCEDURE °· Ask your health care provider about: °· Changing or stopping your regular medicines. This is especially important if you are taking diabetes medicines or blood thinners. °· Taking medicines such as aspirin and ibuprofen. These medicines can thin your blood. Do not take these medicines before your procedure if your health care provider instructs you not to. °· You may need to have a procedure to  examine the inside of your colon with a scope (colonoscopy). Your health care provider may do this to make sure that there are no other causes for your bleeding or pain. °PROCEDURE °· Your health care provider will clean your rectal area with a rinsing solution. °· A lubricating jelly may be placed into your rectum. The jelly may contain a medicine to numb the area (local anesthetic). °· Your health care provider will insert a short scope (anoscope) into your rectum to examine the hemorrhoids. °· One of the following techniques will be used. °Rubber Band Ligation °Your health care provider will place medical instruments through the scope to put rubber bands around the base of your hemorrhoids. The bands will cut off the blood supply to the hemorrhoids. The hemorrhoids will fall off after several days. °Sclerotherapy °Your health care provider will inject medicine through the scope into your hemorrhoids. This will cause them to shrink and dry up. °Infrared Coagulation °Your health care provider will shine a type of light through the scope onto your hemorrhoids. This light will generate energy (infrared radiation). It will cause the hemorrhoids to scar and then fall off. °Each of these procedures may vary among health care providers and hospitals. °AFTER THE PROCEDURE °· You will be monitored to make sure that you have no bleeding. °· Return to your normal activities as told by your health care provider. °  °This information is not intended to replace advice given to you by your health care provider. Make sure you discuss any questions you have with your health care provider. °  °Document Released: 08/08/2009 Document Revised: 07/02/2015 Document Reviewed: 01/06/2015 °Elsevier Interactive Patient Education ©2016 [employer] ° ° °Hemorrhoids °Hemorrhoids are   swollen veins around the rectum or anus. There are two types of hemorrhoids:  °· Internal hemorrhoids. These occur in the veins just inside the rectum. They may  poke through to the outside and become irritated and painful. °· External hemorrhoids. These occur in the veins outside the anus and can be felt as a painful swelling or hard lump near the anus. °CAUSES °· Pregnancy.   °· Obesity.   °· Constipation or diarrhea.   °· Straining to have a bowel movement.   °· Sitting for long periods on the toilet. °· Heavy lifting or other activity that caused you to strain. °· Anal intercourse. °SYMPTOMS  °· Pain.   °· Anal itching or irritation.   °· Rectal bleeding.   °· Fecal leakage.   °· Anal swelling.   °· One or more lumps around the anus.   °DIAGNOSIS  °Your caregiver may be able to diagnose hemorrhoids by visual examination. Other examinations or tests that may be performed include:  °· Examination of the rectal area with a gloved hand (digital rectal exam).   °· Examination of anal canal using a small tube (scope).   °· A blood test if you have lost a significant amount of blood. °· A test to look inside the colon (sigmoidoscopy or colonoscopy). °TREATMENT °Most hemorrhoids can be treated at home. However, if symptoms do not seem to be getting better or if you have a lot of rectal bleeding, your caregiver may perform a procedure to help make the hemorrhoids get smaller or remove them completely. Possible treatments include:  °· Placing a rubber band at the base of the hemorrhoid to cut off the circulation (rubber band ligation).   °· Injecting a chemical to shrink the hemorrhoid (sclerotherapy).   °· Using a tool to burn the hemorrhoid (infrared light therapy).   °· Surgically removing the hemorrhoid (hemorrhoidectomy).   °· Stapling the hemorrhoid to block blood flow to the tissue (hemorrhoid stapling).   °HOME CARE INSTRUCTIONS  °· Eat foods with fiber, such as whole grains, beans, nuts, fruits, and vegetables. Ask your doctor about taking products with added fiber in them (fiber supplements). °· Increase fluid intake. Drink enough water and fluids to keep your urine  clear or pale yellow.   °· Exercise regularly.   °· Go to the bathroom when you have the urge to have a bowel movement. Do not wait.   °· Avoid straining to have bowel movements.   °· Keep the anal area dry and clean. Use wet toilet paper or moist towelettes after a bowel movement.   °· Medicated creams and suppositories may be used or applied as directed.   °· Only take over-the-counter or prescription medicines as directed by your caregiver.   °· Take warm sitz baths for 15-20 minutes, 3-4 times a day to ease pain and discomfort.   °· Place ice packs on the hemorrhoids if they are tender and swollen. Using ice packs between sitz baths may be helpful.   °¨ Put ice in a plastic bag.   °¨ Place a towel between your skin and the bag.   °¨ Leave the ice on for 15-20 minutes, 3-4 times a day.   °· Do not use a donut-shaped pillow or sit on the toilet for long periods. This increases blood pooling and pain.   °SEEK MEDICAL CARE IF: °· You have increasing pain and swelling that is not controlled by treatment or medicine. °· You have uncontrolled bleeding. °· You have difficulty or you are unable to have a bowel movement. °· You have pain or inflammation outside the area of the hemorrhoids. °MAKE SURE YOU: °· Understand these instructions. °· Will   watch your condition. °· Will get help right away if you are not doing well or get worse. °  °This information is not intended to replace advice given to you by your health care provider. Make sure you discuss any questions you have with your health care provider. °  °Document Released: 10/08/2000 Document Revised: 09/27/2012 Document Reviewed: 08/15/2012 °Elsevier Interactive Patient Education ©2016 Elsevier Inc[employer] ° °

## 2016-01-16 NOTE — Progress Notes (Signed)
   Subjective:    Patient ID: Maria Camacho, female    DOB: 06-Jul-1933, 82 y.o.   MRN: [id number]  HPI Pt presents to the office today with rectal pain related to hemorrhoids. Pt states about 2 1/2 years ago this occurred and she went to see a specialists that told her that she did not need surgery and gave her "Roid Relief Cream" that helps. Pt states she is having constant throbbing pain of 8 out 10. Pt states the pain is worse when she "washes". Pt states she is using dibucaine 1% with moderate relief.    Review of Systems  Constitutional: Negative.   HENT: Negative.   Eyes: Negative.   Respiratory: Negative.  Negative for shortness of breath.   Cardiovascular: Negative.  Negative for palpitations.  Gastrointestinal: Negative.   Endocrine: Negative.   Musculoskeletal: Negative.   Neurological: Negative.  Negative for headaches.  Hematological: Negative.   Psychiatric/Behavioral: Negative.   All other systems reviewed and are negative.      Objective:   Physical Exam  Constitutional: She is oriented to person, place, and time. She appears well-developed and well-nourished. No distress.  HENT:  Head: Normocephalic and atraumatic.  Eyes: Pupils are equal, round, and reactive to light.  Neck: Normal range of motion. Neck supple. No thyromegaly present.  Cardiovascular: Normal rate, regular rhythm, normal heart sounds and intact distal pulses.   No murmur heard. Pulmonary/Chest: Effort normal and breath sounds normal. No respiratory distress. She has no wheezes.  Abdominal: Soft. Bowel sounds are normal. She exhibits no distension. There is no tenderness.  Genitourinary:  External hemorrhoid present  Musculoskeletal: Normal range of motion. She exhibits no edema or tenderness.  Neurological: She is alert and oriented to person, place, and time.  Skin: Skin is warm and dry.  Psychiatric: She has a normal mood and affect. Her behavior is normal. Judgment and thought content normal.   Vitals reviewed.   BP 138/78 mmHg  Pulse 71  Temp(Src) 97.1 F (36.2 C) (Oral)  Ht 4\' 10"  (1.473 m)  Wt 148 lb 3.2 oz (67.223 kg)  BMI 30.98 kg/m2       Assessment & Plan:  1. Hemorrhoids, unspecified hemorrhoid type -Increase fiber -Force fluids -Avoid straining -PT to call if not improved and will send to general surgery - hydrocortisone (ANUSOL-HC) 25 MG suppository; Place 1 suppository (25 mg total) rectally 2 (two) times daily.  Dispense: 12 suppository; Refill: 0  Evelina Dun, FNP

## 2016-02-27 ENCOUNTER — Other Ambulatory Visit: Payer: Medicare Other | Admitting: Family

## 2016-03-01 ENCOUNTER — Encounter: Payer: Self-pay | Admitting: Family

## 2016-03-01 ENCOUNTER — Encounter (INDEPENDENT_AMBULATORY_CARE_PROVIDER_SITE_OTHER): Payer: Self-pay

## 2016-03-01 ENCOUNTER — Ambulatory Visit (INDEPENDENT_AMBULATORY_CARE_PROVIDER_SITE_OTHER): Payer: Medicare Other | Admitting: Family

## 2016-03-01 VITALS — BP 140/73 | HR 64 | Temp 98.1°F | Ht <= 58 in | Wt 146.0 lb

## 2016-03-01 DIAGNOSIS — Z85528 Personal history of other malignant neoplasm of kidney: Secondary | ICD-10-CM

## 2016-03-01 DIAGNOSIS — Z01419 Encounter for gynecological examination (general) (routine) without abnormal findings: Secondary | ICD-10-CM | POA: Diagnosis not present

## 2016-03-01 DIAGNOSIS — N8110 Cystocele, unspecified: Secondary | ICD-10-CM

## 2016-03-01 DIAGNOSIS — I10 Essential (primary) hypertension: Secondary | ICD-10-CM

## 2016-03-01 DIAGNOSIS — K649 Unspecified hemorrhoids: Secondary | ICD-10-CM

## 2016-03-01 NOTE — Progress Notes (Signed)
Subjective:    Patient ID: Maria Camacho, female    DOB: 16-Dec-1932, 82 y.o.   MRN: [id number]  Pt presents to the office for pelvic/pap exam.  Pt is followed by Dr. Laurance Flatten for chronic follow up every 4 months. Pt states she "feels good today". Pt denies any headache, palpitations, SOB, or edema at this time. Pt has history of renal carcinoma. Pt also has a cystocele that is stable and has seen a Urologists. Pt states she was told that she did not need surgery at this time for it. Pt also has hemorrhoids that are doing well at this time. Pt is currently using anusol-HC suppositories and sitz baths.  Gynecologic Exam The patient's primary symptoms include vaginal bleeding. The patient's pertinent negatives include no genital itching, genital lesions, genital odor, pelvic pain or vaginal discharge. The patient is experiencing no pain. Pertinent negatives include no chills, constipation, diarrhea, discolored urine, frequency, headaches or vomiting.  Hypertension This is a chronic problem. The current episode started more than 1 year ago. The problem has been resolved since onset. The problem is controlled. Pertinent negatives include no anxiety, headaches, palpitations, peripheral edema or shortness of breath. Risk factors for coronary artery disease include dyslipidemia, obesity, post-menopausal state and sedentary lifestyle. Past treatments include calcium channel blockers and angiotensin blockers. The current treatment provides moderate improvement. There is no history of CAD/MI, CVA, heart failure or a thyroid problem. There is no history of sleep apnea.      Review of Systems  Constitutional: Negative for chills.  Respiratory: Negative for shortness of breath.   Cardiovascular: Negative for palpitations.  Gastrointestinal: Negative for vomiting, diarrhea and constipation.  Genitourinary: Negative for frequency, vaginal discharge and pelvic pain.  Neurological: Negative for headaches.  All  other systems reviewed and are negative.      Objective:   Physical Exam  Constitutional: She is oriented to person, place, and time. She appears well-developed and well-nourished. No distress.  HENT:  Head: Normocephalic and atraumatic.  Right Ear: External ear normal.  Left Ear: External ear normal.  Nose: Nose normal.  Mouth/Throat: Oropharynx is clear and moist.  Eyes: Pupils are equal, round, and reactive to light.  Neck: Normal range of motion. Neck supple. No thyromegaly present.  Cardiovascular: Normal rate, regular rhythm, normal heart sounds and intact distal pulses.   No murmur heard. Pulmonary/Chest: Effort normal and breath sounds normal. No respiratory distress. She has no wheezes. Right breast exhibits no inverted nipple, no mass, no nipple discharge, no skin change and no tenderness. Left breast exhibits no inverted nipple, no mass, no nipple discharge, no skin change and no tenderness. Breasts are symmetrical.  Abdominal: Soft. Bowel sounds are normal. She exhibits no distension. There is no tenderness.  Genitourinary: Vagina normal. Rectal exam shows external hemorrhoid.  Bimanual exam- no adnexal masses or tenderness, ovaries nonpalpable   Cervix not present- No discharge   Musculoskeletal: Normal range of motion. She exhibits no edema or tenderness.  Neurological: She is alert and oriented to person, place, and time. She has normal reflexes. No cranial nerve deficit.  Skin: Skin is warm and dry.  Psychiatric: She has a normal mood and affect. Her behavior is normal. Judgment and thought content normal.  Vitals reviewed.    BP 140/73 mmHg  Pulse 64  Temp(Src) 98.1 F (36.7 C) (Oral)  Ht 4\' 10"  (1.473 m)  Wt 146 lb (66.225 kg)  BMI 30.52 kg/m2      Assessment & Plan:  1. Encounter for routine gynecological examination - Pap IG (Image Guided)  2. Essential hypertension  3. History of renal carcinoma  4. Bladder prolapse, female, acquired  5.  Hemorrhoids, unspecified hemorrhoid type   Continue all meds Labs pending Health Maintenance reviewed Diet and exercise encouraged RTO as needed and keep chronic appts with Dr. Ethlyn Daniels, FNP

## 2016-03-01 NOTE — Patient Instructions (Signed)
Health Maintenance, Female Adopting a healthy lifestyle and getting preventive care can go a long way to promote health and wellness. Talk with your health care provider about what schedule of regular examinations is right for you. This is a good chance for you to check in with your provider about disease prevention and staying healthy. In between checkups, there are plenty of things you can do on your own. Experts have done a lot of research about which lifestyle changes and preventive measures are most likely to keep you healthy. Ask your health care provider for more information. WEIGHT AND DIET  Eat a healthy diet  Be sure to include plenty of vegetables, fruits, low-fat dairy products, and lean protein.  Do not eat a lot of foods high in solid fats, added sugars, or salt.  Get regular exercise. This is one of the most important things you can do for your health.  Most adults should exercise for at least 150 minutes each week. The exercise should increase your heart rate and make you sweat (moderate-intensity exercise).  Most adults should also do strengthening exercises at least twice a week. This is in addition to the moderate-intensity exercise.  Maintain a healthy weight  Body mass index (BMI) is a measurement that can be used to identify possible weight problems. It estimates body fat based on height and weight. Your health care provider can help determine your BMI and help you achieve or maintain a healthy weight.  For females 20 years of age and older:   A BMI below 18.5 is considered underweight.  A BMI of 18.5 to 24.9 is normal.  A BMI of 25 to 29.9 is considered overweight.  A BMI of 30 and above is considered obese.  Watch levels of cholesterol and blood lipids  You should start having your blood tested for lipids and cholesterol at 80 years of age, then have this test every 5 years.  You may need to have your cholesterol levels checked more often if:  Your lipid  or cholesterol levels are high.  You are older than 80 years of age.  You are at high risk for heart disease.  CANCER SCREENING   Lung Cancer  Lung cancer screening is recommended for adults 55-80 years old who are at high risk for lung cancer because of a history of smoking.  A yearly low-dose CT scan of the lungs is recommended for people who:  Currently smoke.  Have quit within the past 15 years.  Have at least a 30-pack-year history of smoking. A pack year is smoking an average of one pack of cigarettes a day for 1 year.  Yearly screening should continue until it has been 15 years since you quit.  Yearly screening should stop if you develop a health problem that would prevent you from having lung cancer treatment.  Breast Cancer  Practice breast self-awareness. This means understanding how your breasts normally appear and feel.  It also means doing regular breast self-exams. Let your health care provider know about any changes, no matter how small.  If you are in your 20s or 30s, you should have a clinical breast exam (CBE) by a health care provider every 1-3 years as part of a regular health exam.  If you are 40 or older, have a CBE every year. Also consider having a breast X-ray (mammogram) every year.  If you have a family history of breast cancer, talk to your health care provider about genetic screening.  If you   are at high risk for breast cancer, talk to your health care provider about having an MRI and a mammogram every year.  Breast cancer gene (BRCA) assessment is recommended for women who have family members with BRCA-related cancers. BRCA-related cancers include:  Breast.  Ovarian.  Tubal.  Peritoneal cancers.  Results of the assessment will determine the need for genetic counseling and BRCA1 and BRCA2 testing. Cervical Cancer Your health care provider may recommend that you be screened regularly for cancer of the pelvic organs (ovaries, uterus, and  vagina). This screening involves a pelvic examination, including checking for microscopic changes to the surface of your cervix (Pap test). You may be encouraged to have this screening done every 3 years, beginning at age 21.  For women ages 30-65, health care providers may recommend pelvic exams and Pap testing every 3 years, or they may recommend the Pap and pelvic exam, combined with testing for human papilloma virus (HPV), every 5 years. Some types of HPV increase your risk of cervical cancer. Testing for HPV may also be done on women of any age with unclear Pap test results.  Other health care providers may not recommend any screening for nonpregnant women who are considered low risk for pelvic cancer and who do not have symptoms. Ask your health care provider if a screening pelvic exam is right for you.  If you have had past treatment for cervical cancer or a condition that could lead to cancer, you need Pap tests and screening for cancer for at least 20 years after your treatment. If Pap tests have been discontinued, your risk factors (such as having a new sexual partner) need to be reassessed to determine if screening should resume. Some women have medical problems that increase the chance of getting cervical cancer. In these cases, your health care provider may recommend more frequent screening and Pap tests. Colorectal Cancer  This type of cancer can be detected and often prevented.  Routine colorectal cancer screening usually begins at 80 years of age and continues through 80 years of age.  Your health care provider may recommend screening at an earlier age if you have risk factors for colon cancer.  Your health care provider may also recommend using home test kits to check for hidden blood in the stool.  A small camera at the end of a tube can be used to examine your colon directly (sigmoidoscopy or colonoscopy). This is done to check for the earliest forms of colorectal  cancer.  Routine screening usually begins at age 50.  Direct examination of the colon should be repeated every 5-10 years through 80 years of age. However, you may need to be screened more often if early forms of precancerous polyps or small growths are found. Skin Cancer  Check your skin from head to toe regularly.  Tell your health care provider about any new moles or changes in moles, especially if there is a change in a mole's shape or color.  Also tell your health care provider if you have a mole that is larger than the size of a pencil eraser.  Always use sunscreen. Apply sunscreen liberally and repeatedly throughout the day.  Protect yourself by wearing long sleeves, pants, a wide-brimmed hat, and sunglasses whenever you are outside. HEART DISEASE, DIABETES, AND HIGH BLOOD PRESSURE   High blood pressure causes heart disease and increases the risk of stroke. High blood pressure is more likely to develop in:  People who have blood pressure in the high end   of the normal range (130-139/85-89 mm Hg).  People who are overweight or obese.  People who are African American.  If you are 38-23 years of age, have your blood pressure checked every 3-5 years. If you are 61 years of age or older, have your blood pressure checked every year. You should have your blood pressure measured twice--once when you are at a hospital or clinic, and once when you are not at a hospital or clinic. Record the average of the two measurements. To check your blood pressure when you are not at a hospital or clinic, you can use:  An automated blood pressure machine at a pharmacy.  A home blood pressure monitor.  If you are between 45 years and 39 years old, ask your health care provider if you should take aspirin to prevent strokes.  Have regular diabetes screenings. This involves taking a blood sample to check your fasting blood sugar level.  If you are at a normal weight and have a low risk for diabetes,  have this test once every three years after 80 years of age.  If you are overweight and have a high risk for diabetes, consider being tested at a younger age or more often. PREVENTING INFECTION  Hepatitis B  If you have a higher risk for hepatitis B, you should be screened for this virus. You are considered at high risk for hepatitis B if:  You were born in a country where hepatitis B is common. Ask your health care provider which countries are considered high risk.  Your parents were born in a high-risk country, and you have not been immunized against hepatitis B (hepatitis B vaccine).  You have HIV or AIDS.  You use needles to inject street drugs.  You live with someone who has hepatitis B.  You have had sex with someone who has hepatitis B.  You get hemodialysis treatment.  You take certain medicines for conditions, including cancer, organ transplantation, and autoimmune conditions. Hepatitis C  Blood testing is recommended for:  Everyone born from [birth year] through 1963.  Anyone with known risk factors for hepatitis C. Sexually transmitted infections (STIs)  You should be screened for sexually transmitted infections (STIs) including gonorrhea and chlamydia if:  You are sexually active and are younger than 80 years of age.  You are older than 80 years of age and your health care provider tells you that you are at risk for this type of infection.  Your sexual activity has changed since you were last screened and you are at an increased risk for chlamydia or gonorrhea. Ask your health care provider if you are at risk.  If you do not have HIV, but are at risk, it may be recommended that you take a prescription medicine daily to prevent HIV infection. This is called pre-exposure prophylaxis (PrEP). You are considered at risk if:  You are sexually active and do not regularly use condoms or know the HIV status of your partner(s).  You take drugs by injection.  You are sexually  active with a partner who has HIV. Talk with your health care provider about whether you are at high risk of being infected with HIV. If you choose to begin PrEP, you should first be tested for HIV. You should then be tested every 3 months for as long as you are taking PrEP.  PREGNANCY   If you are premenopausal and you may become pregnant, ask your health care provider about preconception counseling.  If you may  become pregnant, take 400 to 800 micrograms (mcg) of folic acid every day.  If you want to prevent pregnancy, talk to your health care provider about birth control (contraception). OSTEOPOROSIS AND MENOPAUSE   Osteoporosis is a disease in which the bones lose minerals and strength with aging. This can result in serious bone fractures. Your risk for osteoporosis can be identified using a bone density scan.  If you are 61 years of age or older, or if you are at risk for osteoporosis and fractures, ask your health care provider if you should be screened.  Ask your health care provider whether you should take a calcium or vitamin D supplement to lower your risk for osteoporosis.  Menopause may have certain physical symptoms and risks.  Hormone replacement therapy may reduce some of these symptoms and risks. Talk to your health care provider about whether hormone replacement therapy is right for you.  HOME CARE INSTRUCTIONS   Schedule regular health, dental, and eye exams.  Stay current with your immunizations.   Do not use any tobacco products including cigarettes, chewing tobacco, or electronic cigarettes.  If you are pregnant, do not drink alcohol.  If you are breastfeeding, limit how much and how often you drink alcohol.  Limit alcohol intake to no more than 1 drink per day for nonpregnant women. One drink equals 12 ounces of beer, 5 ounces of wine, or 1 ounces of hard liquor.  Do not use street drugs.  Do not share needles.  Ask your health care provider for help if  you need support or information about quitting drugs.  Tell your health care provider if you often feel depressed.  Tell your health care provider if you have ever been abused or do not feel safe at home.   This information is not intended to replace advice given to you by your health care provider. Make sure you discuss any questions you have with your health care provider.   Document Released: 04/26/2011 Document Revised: 11/01/2014 Document Reviewed: 09/12/2013 Elsevier Interactive Patient Education [employer].

## 2016-03-04 LAB — PAP IG (IMAGE GUIDED): PAP Smear Comment: 0

## 2016-04-08 ENCOUNTER — Encounter: Payer: Self-pay | Admitting: Family Medicine

## 2016-04-08 ENCOUNTER — Ambulatory Visit (INDEPENDENT_AMBULATORY_CARE_PROVIDER_SITE_OTHER): Payer: Medicare Other | Admitting: Family Medicine

## 2016-04-08 VITALS — BP 139/64 | HR 58 | Temp 97.1°F | Ht <= 58 in | Wt 145.0 lb

## 2016-04-08 DIAGNOSIS — C649 Malignant neoplasm of unspecified kidney, except renal pelvis: Secondary | ICD-10-CM

## 2016-04-08 DIAGNOSIS — L819 Disorder of pigmentation, unspecified: Secondary | ICD-10-CM | POA: Diagnosis not present

## 2016-04-08 DIAGNOSIS — E559 Vitamin D deficiency, unspecified: Secondary | ICD-10-CM | POA: Diagnosis not present

## 2016-04-08 DIAGNOSIS — Z85528 Personal history of other malignant neoplasm of kidney: Secondary | ICD-10-CM

## 2016-04-08 DIAGNOSIS — K1370 Unspecified lesions of oral mucosa: Secondary | ICD-10-CM

## 2016-04-08 DIAGNOSIS — I10 Essential (primary) hypertension: Secondary | ICD-10-CM | POA: Diagnosis not present

## 2016-04-08 DIAGNOSIS — E785 Hyperlipidemia, unspecified: Secondary | ICD-10-CM | POA: Diagnosis not present

## 2016-04-08 NOTE — Progress Notes (Signed)
Subjective:    Patient ID: Maria Camacho, female    DOB: 1933-05-21, 82 y.o.   MRN: [id number]  HPI Pt here for follow up and management of chronic medical problems which includes hyperlipidemia and hypertension. She is taking medications regularly.The patient today complains of some skin lesions on her body one on the bridge of her nose another on her right shoulder. She also complains of a knot or white spot under her time. She also has complaints of vaginal irritation. She has a history of renal cell carcinoma.     Patient Active Problem List   Diagnosis Date Noted  . Cystocele 02/05/2015  . Bladder prolapse, female, acquired 05/16/2014  . Vitamin D deficiency 05/16/2014  . Osteopenia 10/10/2013  . Hyperlipidemia 03/14/2013  . Hypertension 03/14/2013  . History of renal carcinoma 02/06/2013  . Encounter for routine gynecological examination 02/06/2013  . Postmenopausal vaginal bleeding 02/06/2013  . Hemorrhoid 02/06/2013   Outpatient Encounter Prescriptions as of 04/08/2016  Medication Sig  . acetaminophen (TYLENOL) 500 MG tablet Take 500 mg by mouth as needed. Reported on 12/29/2015  . amLODipine-valsartan (EXFORGE) 10-320 MG per tablet TAKE (1) TABLET DAILY AS DIRECTED. (Patient taking differently: take 1/2 tablet daily)  . B Complex-C-E-Zn (BEC/ZINC) TABS Take 1 tablet by mouth daily.   . Cholecalciferol (VITAMIN D) 2000 UNITS tablet Take 2,000 Units by mouth daily. Take 2000IU daily M-F and 4000IU Sat and Sun  . conjugated estrogens (PREMARIN) vaginal cream Place 1 g vaginally daily as needed. Reported on 03/01/2016  . fish oil-omega-3 fatty acids 1000 MG capsule Take 1 g by mouth daily.  Vladimir Faster Glycol-Propyl Glycol (SYSTANE) 0.4-0.3 % SOLN Apply 1 drop to eye.  . [DISCONTINUED] hydrocortisone (ANUSOL-HC) 25 MG suppository Place 1 suppository (25 mg total) rectally 2 (two) times daily. (Patient not taking: Reported on 03/01/2016)   No facility-administered encounter  medications on file as of 04/08/2016.      Review of Systems  Constitutional: Negative.   HENT: Positive for mouth sores (white lump / knot under toungue).   Eyes: Negative.   Respiratory: Negative.   Cardiovascular: Negative.   Gastrointestinal: Negative.   Endocrine: Negative.   Genitourinary: Negative.        Vaginal irritation  Musculoskeletal: Negative.   Skin: Negative.        Lesion on right side - upper back  Small lesions - bridge of nose  Allergic/Immunologic: Negative.   Neurological: Negative.   Hematological: Negative.   Psychiatric/Behavioral: Negative.        Objective:   Physical Exam  Constitutional: She is oriented to person, place, and time. She appears well-developed and well-nourished. No distress.  Alert and pleasant  HENT:  Head: Normocephalic and atraumatic.  Right Ear: External ear normal.  Left Ear: External ear normal.  Nose: Nose normal.  Mouth/Throat: Oropharynx is clear and moist. No oropharyngeal exudate.  There is a white cystic-like lesion under the tongue on the right side which is most likely benign but I am not familiar with seeing this and I will get an ear nose and throat specialist to further evaluate her.  Eyes: Conjunctivae and EOM are normal. Pupils are equal, round, and reactive to light. Right eye exhibits no discharge. Left eye exhibits no discharge. No scleral icterus.  Neck: Normal range of motion. Neck supple. No thyromegaly present.  No bruits thyromegaly or anterior cervical adenopathy  Cardiovascular: Normal rate, regular rhythm, normal heart sounds and intact distal pulses.   No murmur  heard. The heart is regular at 60/m  Pulmonary/Chest: Effort normal and breath sounds normal. No respiratory distress. She has no wheezes. She has no rales. She exhibits no tenderness.  Clear anteriorly and posteriorly  Abdominal: Soft. Bowel sounds are normal. She exhibits no mass. There is no tenderness. There is no rebound and no  guarding.  No liver or spleen enlargement no masses and no bruits and no inguinal adenopathy  Musculoskeletal: Normal range of motion. She exhibits no edema.  Lymphadenopathy:    She has no cervical adenopathy.  Neurological: She is alert and oriented to person, place, and time. She has normal reflexes. No cranial nerve deficit.  Skin: Skin is warm and dry. No rash noted.  There is a seborrheic keratosis on her posterior right shoulder. There are 2 small tiny lesions on the bridge of her nose and these are shiny in appearance  Psychiatric: She has a normal mood and affect. Her behavior is normal. Judgment and thought content normal.  Nursing note and vitals reviewed.  BP 139/64 mmHg  Pulse 58  Temp(Src) 97.1 F (36.2 C) (Oral)  Ht 4' 10" (1.473 m)  Wt 145 lb (65.772 kg)  BMI 30.31 kg/m2        Assessment & Plan:  1. Essential hypertension -The blood pressure is good today and she will continue with current treatment. She brings in home readings and I will be scanned into the record. The majority of these were also within normal limits. - BMP8+EGFR - CBC with Differential/Platelet - Hepatic function panel  2. History of renal carcinoma -We will get an ultrasound of the abdomen as a follow-up to her right renal cell carcinoma. - BMP8+EGFR - CBC with Differential/Platelet  3. Hyperlipidemia -Continue omega-3 fatty acids and aggressive therapeutic lifestyle changes pending results of lab work - CBC with Differential/Platelet - Lipid panel  4. Vitamin D deficiency -Continue current vitamin D replacement pending results of lab work - CBC with Differential/Platelet - VITAMIN D 25 Hydroxy (Vit-D Deficiency, Fractures)  5. Atypical pigmented skin lesion -Referred to dermatology for this non-pigmented skin lesion on her nose.  6. Renal carcinoma, unspecified laterality ([place]) -Ultrasound of abdomen for follow-up of renal cell carcinoma  Patient Instructions                        Medicare Annual Wellness Visit  [facility] and the medical providers at [facility] strive to bring you the best medical care.  In doing so we not only want to address your current medical conditions and concerns but also to detect new conditions early and prevent illness, disease and health-related problems.    Medicare offers a yearly Wellness Visit which allows our clinical staff to assess your need for preventative services including immunizations, lifestyle education, counseling to decrease risk of preventable diseases and screening for fall risk and other medical concerns.    This visit is provided free of charge (no copay) for all Medicare recipients. The clinical pharmacists at [facility] have begun to conduct these Wellness Visits which will also include a thorough review of all your medications.    As you primary medical provider recommend that you make an appointment for your Annual Wellness Visit if you have not done so already this year.  You may set up this appointment before you leave today or you may call back [phone]) and schedule an appointment.  Please make sure when you call that you mention  that you are scheduling your Annual Wellness Visit with the clinical pharmacist so that the appointment may be made for the proper length of time.     Continue current medications. Continue good therapeutic lifestyle changes which include good diet and exercise. Fall precautions discussed with patient. If an FOBT was given today- please return it to our front desk. If you are over 84 years old - you may need Prevnar 5 or the adult Pneumonia vaccine.  **Flu shots are available--- please call and schedule a FLU-CLINIC appointment**  After your visit with [place] today you will receive a survey in the mail or online from [employer] regarding your care with [place]. Please take a moment to fill this out. Your feedback is very important to [place] as you  can help [place] better understand your patient needs as well as improve your experience and satisfaction. WE CARE ABOUT YOU!!!   We will arrange for you to have an ultrasound of the abdomen because of the renal cell carcinoma as a follow-up. We will schedule you with an ear nose and throat specialist to evaluate the lesion under your tongue We will also schedule you for an appointment with [facility] dermatology to evaluate the areas on the nose and to do a general skin evaluation. Remember to use scent free fabric softeners Use a soap that has no fragrance And use detergents that are scent free   Arrie Senate MD

## 2016-04-08 NOTE — Patient Instructions (Addendum)
Medicare Annual Wellness Visit  [facility] and the medical providers at [facility] strive to bring you the best medical care.  In doing so we not only want to address your current medical conditions and concerns but also to detect new conditions early and prevent illness, disease and health-related problems.    Medicare offers a yearly Wellness Visit which allows our clinical staff to assess your need for preventative services including immunizations, lifestyle education, counseling to decrease risk of preventable diseases and screening for fall risk and other medical concerns.    This visit is provided free of charge (no copay) for all Medicare recipients. The clinical pharmacists at [facility] have begun to conduct these Wellness Visits which will also include a thorough review of all your medications.    As you primary medical provider recommend that you make an appointment for your Annual Wellness Visit if you have not done so already this year.  You may set up this appointment before you leave today or you may call back [phone] and schedule an appointment.  Please make sure when you call that you mention that you are scheduling your Annual Wellness Visit with the clinical pharmacist so that the appointment may be made for the proper length of time.     Continue current medications. Continue good therapeutic lifestyle changes which include good diet and exercise. Fall precautions discussed with patient. If an FOBT was given today- please return it to our front desk. If you are over 69 years old - you may need Prevnar 21 or the adult Pneumonia vaccine.  **Flu shots are available--- please call and schedule a FLU-CLINIC appointment**  After your visit with [place] today you will receive a survey in the mail or online from [employer] regarding your care with [place]. Please take a moment to fill this out. Your feedback is very  important to [place] as you can help [place] better understand your patient needs as well as improve your experience and satisfaction. WE CARE ABOUT YOU!!!   We will arrange for you to have an ultrasound of the abdomen because of the renal cell carcinoma as a follow-up. We will schedule you with an ear nose and throat specialist to evaluate the lesion under your tongue We will also schedule you for an appointment with [facility] dermatology to evaluate the areas on the nose and to do a general skin evaluation. Remember to use scent free fabric softeners Use a soap that has no fragrance And use detergents that are scent free

## 2016-04-09 LAB — CBC WITH DIFFERENTIAL/PLATELET
Basophils Absolute: 0 10*3/uL (ref 0.0–0.2)
Basos: 1 %
EOS (ABSOLUTE): 0.2 10*3/uL (ref 0.0–0.4)
EOS: 3 %
HEMATOCRIT: 43.9 % (ref 34.0–46.6)
HEMOGLOBIN: 14.2 g/dL (ref 11.1–15.9)
IMMATURE GRANULOCYTES: 0 %
Immature Grans (Abs): 0 10*3/uL (ref 0.0–0.1)
Lymphocytes Absolute: 3.7 10*3/uL — ABNORMAL HIGH (ref 0.7–3.1)
Lymphs: 45 %
MCH: 29.8 pg (ref 26.6–33.0)
MCHC: 32.3 g/dL (ref 31.5–35.7)
MCV: 92 fL (ref 79–97)
MONOCYTES: 9 %
Monocytes Absolute: 0.8 10*3/uL (ref 0.1–0.9)
NEUTROS PCT: 42 %
Neutrophils Absolute: 3.4 10*3/uL (ref 1.4–7.0)
Platelets: 265 10*3/uL (ref 150–379)
RBC: 4.76 x10E6/uL (ref 3.77–5.28)
RDW: 13.3 % (ref 12.3–15.4)
WBC: 8.1 10*3/uL (ref 3.4–10.8)

## 2016-04-09 LAB — VITAMIN D 25 HYDROXY (VIT D DEFICIENCY, FRACTURES): VIT D 25 HYDROXY: 46.6 ng/mL (ref 30.0–100.0)

## 2016-04-09 LAB — LIPID PANEL
CHOL/HDL RATIO: 2.4 ratio (ref 0.0–4.4)
Cholesterol, Total: 211 mg/dL — ABNORMAL HIGH (ref 100–199)
HDL: 87 mg/dL (ref >39)
LDL Calculated: 103 mg/dL — ABNORMAL HIGH (ref 0–99)
Triglycerides: 103 mg/dL (ref 0–149)
VLDL Cholesterol Cal: 21 mg/dL (ref 5–40)

## 2016-04-09 LAB — HEPATIC FUNCTION PANEL
ALBUMIN: 4.3 g/dL (ref 3.5–4.7)
ALK PHOS: 57 IU/L (ref 39–117)
ALT: 16 IU/L (ref 0–32)
AST: 21 IU/L (ref 0–40)
BILIRUBIN TOTAL: 0.4 mg/dL (ref 0.0–1.2)
BILIRUBIN, DIRECT: 0.11 mg/dL (ref 0.00–0.40)
Total Protein: 7.6 g/dL (ref 6.0–8.5)

## 2016-04-09 LAB — BMP8+EGFR
BUN / CREAT RATIO: 16 (ref 12–28)
BUN: 15 mg/dL (ref 8–27)
CO2: 22 mmol/L (ref 18–29)
CREATININE: 0.95 mg/dL (ref 0.57–1.00)
Calcium: 9.7 mg/dL (ref 8.7–10.3)
Chloride: 101 mmol/L (ref 96–106)
GFR calc Af Amer: 65 mL/min/{1.73_m2} (ref >59)
GFR, EST NON AFRICAN AMERICAN: 57 mL/min/{1.73_m2} — AB (ref >59)
GLUCOSE: 82 mg/dL (ref 65–99)
Potassium: 4.4 mmol/L (ref 3.5–5.2)
Sodium: 141 mmol/L (ref 134–144)

## 2016-04-19 ENCOUNTER — Ambulatory Visit (HOSPITAL_COMMUNITY)
Admission: RE | Admit: 2016-04-19 | Discharge: 2016-04-19 | Disposition: A | Discharge status: Home / Self Care | Payer: Medicare Other | Source: Ambulatory Visit | Attending: Family Medicine | Admitting: Family Medicine

## 2016-04-19 DIAGNOSIS — N281 Cyst of kidney, acquired: Secondary | ICD-10-CM | POA: Diagnosis not present

## 2016-04-19 DIAGNOSIS — N270 Small kidney, unilateral: Secondary | ICD-10-CM | POA: Insufficient documentation

## 2016-04-19 DIAGNOSIS — C649 Malignant neoplasm of unspecified kidney, except renal pelvis: Secondary | ICD-10-CM

## 2016-04-19 DIAGNOSIS — Z85528 Personal history of other malignant neoplasm of kidney: Secondary | ICD-10-CM | POA: Insufficient documentation

## 2016-04-22 ENCOUNTER — Other Ambulatory Visit: Payer: Self-pay | Admitting: Family Medicine

## 2016-07-01 ENCOUNTER — Other Ambulatory Visit: Payer: Self-pay | Admitting: Family Medicine

## 2016-07-19 ENCOUNTER — Encounter: Payer: Medicare Other | Admitting: *Deleted

## 2016-07-29 ENCOUNTER — Ambulatory Visit (INDEPENDENT_AMBULATORY_CARE_PROVIDER_SITE_OTHER): Payer: Medicare Other

## 2016-07-29 DIAGNOSIS — Z23 Encounter for immunization: Secondary | ICD-10-CM | POA: Diagnosis not present

## 2016-08-31 ENCOUNTER — Ambulatory Visit: Payer: Medicare Other | Admitting: Family Medicine

## 2016-09-22 ENCOUNTER — Ambulatory Visit (INDEPENDENT_AMBULATORY_CARE_PROVIDER_SITE_OTHER): Payer: Medicare Other | Admitting: Family Medicine

## 2016-09-22 ENCOUNTER — Encounter: Payer: Self-pay | Admitting: Family Medicine

## 2016-09-22 VITALS — BP 130/77 | HR 81 | Temp 98.6°F | Ht <= 58 in | Wt 147.0 lb

## 2016-09-22 DIAGNOSIS — J069 Acute upper respiratory infection, unspecified: Secondary | ICD-10-CM

## 2016-09-22 DIAGNOSIS — E7800 Pure hypercholesterolemia, unspecified: Secondary | ICD-10-CM

## 2016-09-22 DIAGNOSIS — I10 Essential (primary) hypertension: Secondary | ICD-10-CM | POA: Diagnosis not present

## 2016-09-22 DIAGNOSIS — N8110 Cystocele, unspecified: Secondary | ICD-10-CM

## 2016-09-22 DIAGNOSIS — Z85528 Personal history of other malignant neoplasm of kidney: Secondary | ICD-10-CM

## 2016-09-22 DIAGNOSIS — E559 Vitamin D deficiency, unspecified: Secondary | ICD-10-CM

## 2016-09-22 DIAGNOSIS — C649 Malignant neoplasm of unspecified kidney, except renal pelvis: Secondary | ICD-10-CM

## 2016-09-22 NOTE — Patient Instructions (Addendum)
Medicare Annual Wellness Visit  [facility] and the medical providers at [facility] strive to bring you the best medical care.  In doing so we not only want to address your current medical conditions and concerns but also to detect new conditions early and prevent illness, disease and health-related problems.    Medicare offers a yearly Wellness Visit which allows our clinical staff to assess your need for preventative services including immunizations, lifestyle education, counseling to decrease risk of preventable diseases and screening for fall risk and other medical concerns.    This visit is provided free of charge (no copay) for all Medicare recipients. The clinical pharmacists at [facility] have begun to conduct these Wellness Visits which will also include a thorough review of all your medications.    As you primary medical provider recommend that you make an appointment for your Annual Wellness Visit if you have not done so already this year.  You may set up this appointment before you leave today or you may call back [phone] and schedule an appointment.  Please make sure when you call that you mention that you are scheduling your Annual Wellness Visit with the clinical pharmacist so that the appointment may be made for the proper length of time.     Continue current medications. Continue good therapeutic lifestyle changes which include good diet and exercise. Fall precautions discussed with patient. If an FOBT was given today- please return it to our front desk. If you are over 61 years old - you may need Prevnar 38 or the adult Pneumonia vaccine.  **Flu shots are available--- please call and schedule a FLU-CLINIC appointment**  After your visit with [place] today you will receive a survey in the mail or online from [employer] regarding your care with [place]. Please take a moment to fill this out. Your feedback is very  important to [place] as you can help [place] better understand your patient needs as well as improve your experience and satisfaction. WE CARE ABOUT YOU!!!   Continue to follow-up with urology, ear nose and throat, and dermatology as well as ophthalmology. Keep the house as cool as possible, use nasal saline frequently in each nostril and use Mucinex plain blue and white in color as needed for cough and congestion

## 2016-09-22 NOTE — Progress Notes (Signed)
Subjective:    Patient ID: Maria Camacho, female    DOB: 1933-04-04, 82 y.o.   MRN: [id number]  HPI Pt here for follow up and management of chronic medical problems which includes hyperlipidemia. She is taking medications regularly.The patient today complains of drainage and left-sided facial pressure. She did take a Z-Pak. She is due to return an FOBT and get lab work today. She sees several specialists, the urologist, the dermatologist, the ear nose and throat specialist and the ophthalmologist. The ophthalmologist is Dr. Satira Sark. The ENT doctor is Dr. Rip Harbour daily. The dermatologist is Dr. Margaretmary Bayley worth. The bladder specialist is Dr. Estill Dooms. We discussed the patient's husband with her initially. She denies any chest pain or shortness of breath. She is seeing the urologist regularly as far as her bladder and considering repair for this. She's not having any symptoms as far as burning or pain currently and no recent infection. She has had some problems with her hemorrhoids and has used some cream or ointment that seems to help that. She also has some problems with some chest pain which she does not chew her food well and she thinks it is secondary to her hiatal hernia. She otherwise denies any blood in the stool or black tarry bowel movements. She says that her stools are about twice daily and this is been pretty regular. She plans to go back soon to see the ear nose and throat specialist regarding a possible biopsy on her time. She had a skin biopsy on her neck that was normal. She has plans to follow-up with the urologist for the bladder prolapse. She is thinking that she may also need to have cataract surgery because of increased pressure and enlarging cataracts. She does complain of some left facial and teeth pain and she did take 2 rounds of a Z-Pak from the dentist and she feels like she still having some issues with this.    Patient Active Problem List   Diagnosis Date Noted  . Cystocele 02/05/2015    . Bladder prolapse, female, acquired 05/16/2014  . Vitamin D deficiency 05/16/2014  . Osteopenia 10/10/2013  . Hyperlipidemia 03/14/2013  . Hypertension 03/14/2013  . History of renal carcinoma 02/06/2013  . Encounter for routine gynecological examination 02/06/2013  . Postmenopausal vaginal bleeding 02/06/2013  . Hemorrhoid 02/06/2013   Outpatient Encounter Prescriptions as of 09/22/2016  Medication Sig  . acetaminophen (TYLENOL) 500 MG tablet Take 500 mg by mouth as needed. Reported on 12/29/2015  . amLODipine-valsartan (EXFORGE) 10-320 MG tablet TAKE (1) TABLET DAILY AS DIRECTED.  . B Complex-C-E-Zn (BEC/ZINC) TABS Take 1 tablet by mouth daily.   . Cholecalciferol (VITAMIN D) 2000 UNITS tablet Take 2,000 Units by mouth daily. Take 2000IU daily M-F and 4000IU Sat and Sun  . conjugated estrogens (PREMARIN) vaginal cream Place 1 g vaginally daily as needed. Reported on 03/01/2016  . fish oil-omega-3 fatty acids 1000 MG capsule Take 1 g by mouth daily.  Marland Kitchen latanoprost (XALATAN) 0.005 % ophthalmic solution   . Polyethyl Glycol-Propyl Glycol (SYSTANE) 0.4-0.3 % SOLN Apply 1 drop to eye.  . meclizine (ANTIVERT) 25 MG tablet TAKE 1 TABLET THREE TIMES DAILY WITH MEALS AS NEEDED FOR DIZZINESS (Patient not taking: Reported on 09/22/2016)   No facility-administered encounter medications on file as of 09/22/2016.       Review of Systems  Constitutional: Negative.   HENT: Positive for postnasal drip and sinus pressure (left side face pain).   Eyes: Negative.   Respiratory: Negative.  Cardiovascular: Negative.   Gastrointestinal: Negative.   Endocrine: Negative.   Genitourinary: Negative.   Musculoskeletal: Negative.   Skin: Negative.   Allergic/Immunologic: Negative.   Neurological: Negative.   Hematological: Negative.   Psychiatric/Behavioral: Negative.        Objective:   Physical Exam  Constitutional: She is oriented to person, place, and time. She appears well-developed and  well-nourished. No distress.  Pleasant and alert and younger looking than her stated age  HENT:  Head: Normocephalic and atraumatic.  Right Ear: External ear normal.  Left Ear: External ear normal.  Nose: Nose normal.  Mouth/Throat: Oropharynx is clear and moist.  The throat appeared normal. There was some nasal congestion bilaterally and without drainage. No sinus tenderness with pressure being applied  Eyes: Conjunctivae and EOM are normal. Pupils are equal, round, and reactive to light. Right eye exhibits no discharge. Left eye exhibits no discharge. No scleral icterus.  Continue to follow-up with Dr. Satira Sark  Neck: Normal range of motion. Neck supple. No thyromegaly present.  No anterior cervical adenopathy or bruits  Cardiovascular: Normal rate, regular rhythm, normal heart sounds and intact distal pulses.   No murmur heard. The heart had a regular rate and rhythm at 72/m  Pulmonary/Chest: Effort normal and breath sounds normal. No respiratory distress. She has no wheezes. She has no rales.  Clear anteriorly and posteriorly  Abdominal: Soft. Bowel sounds are normal. She exhibits no mass. There is no tenderness. There is no rebound and no guarding.  Musculoskeletal: Normal range of motion. She exhibits no edema.  Lymphadenopathy:    She has no cervical adenopathy.  Neurological: She is alert and oriented to person, place, and time. She has normal reflexes. No cranial nerve deficit.  Skin: Skin is warm and dry. No rash noted.  Psychiatric: She has a normal mood and affect. Her behavior is normal. Judgment and thought content normal.  Nursing note and vitals reviewed.  BP 130/77 (BP Location: Left Arm)   Pulse 81   Temp 98.6 F (37 C) (Oral)   Ht 4' 10"  (1.473 m)   Wt 147 lb (66.7 kg)   BMI 30.72 kg/m         Assessment & Plan:  1. Essential hypertension -The blood pressure is good today and she will continue with current treatment - BMP8+EGFR; Future - CBC with  Differential/Platelet; Future - Hepatic function panel; Future  2. Pure hypercholesterolemia -The patient has a history of elevated cholesterol and has preferred not to take any statin drugs. She's been sensitive to some of these. - CBC with Differential/Platelet; Future - Lipid panel; Future  3. Vitamin D deficiency -Continue current treatment pending results of lab work - CBC with Differential/Platelet; Future - VITAMIN D 25 Hydroxy (Vit-D Deficiency, Fractures); Future  4. Renal carcinoma, unspecified laterality ([place]) -Continue follow-up with urologist and monitoring this condition - BMP8+EGFR; Future - CBC with Differential/Platelet; Future  5. Upper respiratory tract infection, unspecified type -The patient is encouraged to use nasal saline and Mucinex and drink plenty of fluids and keep the house as cool as possible. She understands to call back if the pressure or pain return in the left maxillary sinus and we will try another antibiotic at that time.  6. Bladder prolapse, female, acquired -Continue to follow-up with urology for the prolapse.  7. History of renal carcinoma -Continue to follow-up with urologist  Patient Instructions  Medicare Annual Wellness Visit  [facility] and the medical providers at [facility] strive to bring you the best medical care.  In doing so we not only want to address your current medical conditions and concerns but also to detect new conditions early and prevent illness, disease and health-related problems.    Medicare offers a yearly Wellness Visit which allows our clinical staff to assess your need for preventative services including immunizations, lifestyle education, counseling to decrease risk of preventable diseases and screening for fall risk and other medical concerns.    This visit is provided free of charge (no copay) for all Medicare recipients. The clinical pharmacists at [facility] have begun to conduct these Wellness Visits which will also include a thorough review of all your medications.    As you primary medical provider recommend that you make an appointment for your Annual Wellness Visit if you have not done so already this year.  You may set up this appointment before you leave today or you may call back [phone]) and schedule an appointment.  Please make sure when you call that you mention that you are scheduling your Annual Wellness Visit with the clinical pharmacist so that the appointment may be made for the proper length of time.     Continue current medications. Continue good therapeutic lifestyle changes which include good diet and exercise. Fall precautions discussed with patient. If an FOBT was given today- please return it to our front desk. If you are over 30 years old - you may need Prevnar 33 or the adult Pneumonia vaccine.  **Flu shots are available--- please call and schedule a FLU-CLINIC appointment**  After your visit with [place] today you will receive a survey in the mail or online from [employer] regarding your care with [place]. Please take a moment to fill this out. Your feedback is very important to [place] as you can help [place] better understand your patient needs as well as improve your experience and satisfaction. WE CARE ABOUT YOU!!!   Continue to follow-up with urology, ear nose and throat, and dermatology as well as ophthalmology. Keep the house as cool as possible, use nasal saline frequently in each nostril and use Mucinex plain blue and white in color as needed for cough and congestion  Arrie Senate MD

## 2016-09-23 LAB — HEPATIC FUNCTION PANEL
ALBUMIN: 4.6 g/dL (ref 3.5–4.7)
ALK PHOS: 65 IU/L (ref 39–117)
ALT: 19 IU/L (ref 0–32)
AST: 24 IU/L (ref 0–40)
BILIRUBIN TOTAL: 0.2 mg/dL (ref 0.0–1.2)
BILIRUBIN, DIRECT: 0.08 mg/dL (ref 0.00–0.40)
TOTAL PROTEIN: 7.8 g/dL (ref 6.0–8.5)

## 2016-09-23 LAB — CBC WITH DIFFERENTIAL/PLATELET
BASOS ABS: 0 10*3/uL (ref 0.0–0.2)
Basos: 0 %
EOS (ABSOLUTE): 0.2 10*3/uL (ref 0.0–0.4)
EOS: 2 %
HEMATOCRIT: 45 % (ref 34.0–46.6)
HEMOGLOBIN: 14.8 g/dL (ref 11.1–15.9)
IMMATURE GRANS (ABS): 0 10*3/uL (ref 0.0–0.1)
Immature Granulocytes: 0 %
LYMPHS: 42 %
Lymphocytes Absolute: 4.5 10*3/uL — ABNORMAL HIGH (ref 0.7–3.1)
MCH: 30.8 pg (ref 26.6–33.0)
MCHC: 32.9 g/dL (ref 31.5–35.7)
MCV: 94 fL (ref 79–97)
MONOCYTES: 9 %
Monocytes Absolute: 1 10*3/uL — ABNORMAL HIGH (ref 0.1–0.9)
Neutrophils Absolute: 5.1 10*3/uL (ref 1.4–7.0)
Neutrophils: 47 %
Platelets: 288 10*3/uL (ref 150–379)
RBC: 4.81 x10E6/uL (ref 3.77–5.28)
RDW: 13.2 % (ref 12.3–15.4)
WBC: 10.7 10*3/uL (ref 3.4–10.8)

## 2016-09-23 LAB — LIPID PANEL
CHOL/HDL RATIO: 3 ratio (ref 0.0–4.4)
Cholesterol, Total: 231 mg/dL — ABNORMAL HIGH (ref 100–199)
HDL: 78 mg/dL (ref >39)
LDL CALC: 127 mg/dL — AB (ref 0–99)
Triglycerides: 128 mg/dL (ref 0–149)
VLDL Cholesterol Cal: 26 mg/dL (ref 5–40)

## 2016-09-23 LAB — BMP8+EGFR
BUN/Creatinine Ratio: 18 (ref 12–28)
BUN: 19 mg/dL (ref 8–27)
CALCIUM: 9.8 mg/dL (ref 8.7–10.3)
CHLORIDE: 101 mmol/L (ref 96–106)
CO2: 22 mmol/L (ref 18–29)
Creatinine, Ser: 1.05 mg/dL — ABNORMAL HIGH (ref 0.57–1.00)
GFR calc Af Amer: 58 mL/min/{1.73_m2} — ABNORMAL LOW (ref >59)
GFR calc non Af Amer: 50 mL/min/{1.73_m2} — ABNORMAL LOW (ref >59)
GLUCOSE: 93 mg/dL (ref 65–99)
POTASSIUM: 4.9 mmol/L (ref 3.5–5.2)
Sodium: 143 mmol/L (ref 134–144)

## 2016-09-23 LAB — VITAMIN D 25 HYDROXY (VIT D DEFICIENCY, FRACTURES): VIT D 25 HYDROXY: 44.6 ng/mL (ref 30.0–100.0)

## 2016-09-24 ENCOUNTER — Other Ambulatory Visit: Payer: Medicare Other

## 2016-09-24 DIAGNOSIS — Z1211 Encounter for screening for malignant neoplasm of colon: Secondary | ICD-10-CM

## 2016-09-25 LAB — FECAL OCCULT BLOOD, IMMUNOCHEMICAL: Fecal Occult Bld: NEGATIVE

## 2017-01-04 ENCOUNTER — Other Ambulatory Visit: Payer: Self-pay | Admitting: Family Medicine

## 2017-02-09 ENCOUNTER — Ambulatory Visit: Payer: Medicare Other | Admitting: Family Medicine

## 2017-02-14 ENCOUNTER — Ambulatory Visit (INDEPENDENT_AMBULATORY_CARE_PROVIDER_SITE_OTHER): Payer: Medicare Other

## 2017-02-14 ENCOUNTER — Encounter: Payer: Self-pay | Admitting: Family Medicine

## 2017-02-14 ENCOUNTER — Ambulatory Visit (INDEPENDENT_AMBULATORY_CARE_PROVIDER_SITE_OTHER): Payer: Medicare Other | Admitting: Family Medicine

## 2017-02-14 VITALS — BP 130/74 | HR 77 | Temp 98.7°F | Ht <= 58 in | Wt 146.0 lb

## 2017-02-14 DIAGNOSIS — E7800 Pure hypercholesterolemia, unspecified: Secondary | ICD-10-CM

## 2017-02-14 DIAGNOSIS — L821 Other seborrheic keratosis: Secondary | ICD-10-CM

## 2017-02-14 DIAGNOSIS — N8110 Cystocele, unspecified: Secondary | ICD-10-CM

## 2017-02-14 DIAGNOSIS — C649 Malignant neoplasm of unspecified kidney, except renal pelvis: Secondary | ICD-10-CM | POA: Diagnosis not present

## 2017-02-14 DIAGNOSIS — I10 Essential (primary) hypertension: Secondary | ICD-10-CM

## 2017-02-14 DIAGNOSIS — E559 Vitamin D deficiency, unspecified: Secondary | ICD-10-CM

## 2017-02-14 DIAGNOSIS — J069 Acute upper respiratory infection, unspecified: Secondary | ICD-10-CM

## 2017-02-14 MED ORDER — AZITHROMYCIN 250 MG PO TABS: ORAL_TABLET | ORAL | 0 refills | Status: DC

## 2017-02-14 NOTE — Patient Instructions (Addendum)
Medicare Annual Wellness Visit  [facility] and the medical providers at [facility] strive to bring you the best medical care.  In doing so we not only want to address your current medical conditions and concerns but also to detect new conditions early and prevent illness, disease and health-related problems.    Medicare offers a yearly Wellness Visit which allows our clinical staff to assess your need for preventative services including immunizations, lifestyle education, counseling to decrease risk of preventable diseases and screening for fall risk and other medical concerns.    This visit is provided free of charge (no copay) for all Medicare recipients. The clinical pharmacists at [facility] have begun to conduct these Wellness Visits which will also include a thorough review of all your medications.    As you primary medical provider recommend that you make an appointment for your Annual Wellness Visit if you have not done so already this year.  You may set up this appointment before you leave today or you may call back [phone]) and schedule an appointment.  Please make sure when you call that you mention that you are scheduling your Annual Wellness Visit with the clinical pharmacist so that the appointment may be made for the proper length of time.     Continue current medications. Continue good therapeutic lifestyle changes which include good diet and exercise. Fall precautions discussed with patient. If an FOBT was given today- please return it to our front desk. If you are over 67 years old - you may need Prevnar 25 or the adult Pneumonia vaccine.  **Flu shots are available--- please call and schedule a FLU-CLINIC appointment**  After your visit with [place] today you will receive a survey in the mail or online from [employer] regarding your care with [place]. Please take a moment to fill this out. Your feedback is very  important to [place] as you can help [place] better understand your patient needs as well as improve your experience and satisfaction. WE CARE ABOUT YOU!!!   Continue with Mucinex maximum strength, plain, blue and white in color and take one twice daily with cough and congestion with a large glass of water Continue to drink plenty of fluids If the sputum changes color or this does not get better within a week go ahead and start the Z-Pak which is the prescription that was given to you when he left the office today Call us back if you need to refer any problems We will call with lab work results and chest x-ray results as soon as those become available

## 2017-02-14 NOTE — Progress Notes (Signed)
Subjective:    Patient ID: Maria Camacho, female    DOB: 1932-10-29, 83 y.o.   MRN: [id number]  HPI Pt here for follow up and management of chronic medical problems which includes hyperlipidemia and hypertension. She is taking medication regularly.The patient today does complain of some cough and congestion that started 3 days ago. She also is worried about a lesion in her hair and on her chin. She is due to get a chest x-ray and lab work today. The patient denies any chest pain or shortness of breath other than the cough with congestion that she's had for the past 3 days. She had a little bit of nausea when the cough and congestion started but this is better. She denies any vomiting diarrhea blood in the stool or black tarry bowel movements. She continues to see the urologist who is aware of her renal cell carcinoma because of bladder issues that she is having. She denies any fever or colored sputum. She is been taking some abusive neck recently. She is passing her water without problems and has been using yogurt and this seems to taking care of some of the vaginal issues that she's had in the past. She was told by the ophthalmologist that she does have cataracts and she is following up with the ophthalmologist.    Patient Active Problem List   Diagnosis Date Noted  . Cystocele 02/05/2015  . Bladder prolapse, female, acquired 05/16/2014  . Vitamin D deficiency 05/16/2014  . Osteopenia 10/10/2013  . Hyperlipidemia 03/14/2013  . Hypertension 03/14/2013  . History of renal carcinoma 02/06/2013  . Encounter for routine gynecological examination 02/06/2013  . Postmenopausal vaginal bleeding 02/06/2013  . Hemorrhoid 02/06/2013   Outpatient Encounter Prescriptions as of 02/14/2017  Medication Sig  . acetaminophen (TYLENOL) 500 MG tablet Take 500 mg by mouth as needed. Reported on 12/29/2015  . amLODipine-valsartan (EXFORGE) 10-320 MG tablet TAKE (1) TABLET DAILY AS DIRECTED.  . B Complex-C-E-Zn  (BEC/ZINC) TABS Take 1 tablet by mouth daily.   . Cholecalciferol (VITAMIN D) 2000 UNITS tablet Take 2,000 Units by mouth daily. Take 2000IU daily M-F and 4000IU Sat and Sun  . conjugated estrogens (PREMARIN) vaginal cream Place 1 g vaginally daily as needed. Reported on 03/01/2016  . fish oil-omega-3 fatty acids 1000 MG capsule Take 1 g by mouth daily.  . [DISCONTINUED] latanoprost (XALATAN) 0.005 % ophthalmic solution   . [DISCONTINUED] meclizine (ANTIVERT) 25 MG tablet TAKE 1 TABLET THREE TIMES DAILY WITH MEALS AS NEEDED FOR DIZZINESS (Patient not taking: Reported on 09/22/2016)  . [DISCONTINUED] Polyethyl Glycol-Propyl Glycol (SYSTANE) 0.4-0.3 % SOLN Apply 1 drop to eye.   No facility-administered encounter medications on file as of 02/14/2017.       Review of Systems  Constitutional: Negative.   HENT: Positive for congestion.   Eyes: Negative.   Respiratory: Positive for cough.   Cardiovascular: Negative.   Gastrointestinal: Negative.   Endocrine: Negative.   Genitourinary: Negative.   Musculoskeletal: Negative.   Skin: Negative.        Skin lesion - chin and in hair/head  Allergic/Immunologic: Negative.   Neurological: Negative.   Hematological: Negative.   Psychiatric/Behavioral: Negative.        Objective:   Physical Exam  Constitutional: She is oriented to person, place, and time. She appears well-developed and well-nourished. No distress.  The patient is pleasant and alert. She has a dry cough.  HENT:  Head: Normocephalic and atraumatic.  Right Ear: External ear normal.  Left Ear: External ear normal.  Mouth/Throat: Oropharynx is clear and moist. No oropharyngeal exudate.  Slight nasal congestion bilaterally  Eyes: Conjunctivae and EOM are normal. Pupils are equal, round, and reactive to light. Right eye exhibits no discharge. Left eye exhibits no discharge. No scleral icterus.  Neck: Normal range of motion. Neck supple. No thyromegaly present.  No bruits thyromegaly  or anterior cervical adenopathy.  Cardiovascular: Normal rate, regular rhythm, normal heart sounds and intact distal pulses.   No murmur heard. The heart has a regular rate and rhythm at 72/m  Pulmonary/Chest: Effort normal and breath sounds normal. No respiratory distress. She has no wheezes. She has no rales.  No wheezing bronchial congestion and only a dry cough  Abdominal: Soft. Bowel sounds are normal. She exhibits no mass. There is no tenderness. There is no rebound and no guarding.  No organ enlargement or masses or bruits  Musculoskeletal: Normal range of motion. She exhibits no edema.  Lymphadenopathy:    She has no cervical adenopathy.  Neurological: She is alert and oriented to person, place, and time. She has normal reflexes. No cranial nerve deficit.  Skin: Skin is warm and dry. No rash noted.  Psychiatric: She has a normal mood and affect. Her behavior is normal. Judgment and thought content normal.  Nursing note and vitals reviewed.  BP 130/74 (BP Location: Left Arm)   Pulse 77   Temp 98.7 F (37.1 C) (Oral)   Ht _0  (1.473 m)   Wt 146 lb (66.2 kg)   BMI 30.51 kg/m         Assessment & Plan:  1. Essential hypertension -The blood pressure is good and she will continue with current treatment - BMP8+EGFR - CBC with Differential/Platelet - Hepatic function panel - DG Chest 2 View; Future  2. Pure hypercholesterolemia -Continue with aggressive therapeutic lifestyle changes - CBC with Differential/Platelet - NMR, lipoprofile - DG Chest 2 View; Future  3. Vitamin D deficiency -[place] with current treatment pending results of lab work - CBC with Differential/Platelet - VITAMIN D 25 Hydroxy (Vit-D Deficiency, Fractures)  4. Renal carcinoma, unspecified laterality ([place]) -Follow-up with urology as planned - CBC with Differential/Platelet  5. Seborrheic keratosis -We will continue to monitor this and patient was reassured.  6. Upper respiratory tract  infection, unspecified type -Continue with Mucinex and drink plenty of fluids and take Tylenol for aches pains and fever  7. Bladder prolapse, female, acquired -Follow-up with bladder specialist as planned  Meds ordered this encounter  Medications  . azithromycin (ZITHROMAX) 250 MG tablet    Sig: As directed    Dispense:  6 tablet    Refill:  0   Patient Instructions                       Medicare Annual Wellness Visit  [facility] and the medical providers at [facility] strive to bring you the best medical care.  In doing so we not only want to address your current medical conditions and concerns but also to detect new conditions early and prevent illness, disease and health-related problems.    Medicare offers a yearly Wellness Visit which allows our clinical staff to assess your need for preventative services including immunizations, lifestyle education, counseling to decrease risk of preventable diseases and screening for fall risk and other medical concerns.    This visit is provided free of charge (no copay) for all Medicare recipients. The clinical pharmacists at  [facility] have begun to conduct these Wellness Visits which will also include a thorough review of all your medications.    As you primary medical provider recommend that you make an appointment for your Annual Wellness Visit if you have not done so already this year.  You may set up this appointment before you leave today or you may call back [phone]) and schedule an appointment.  Please make sure when you call that you mention that you are scheduling your Annual Wellness Visit with the clinical pharmacist so that the appointment may be made for the proper length of time.     Continue current medications. Continue good therapeutic lifestyle changes which include good diet and exercise. Fall precautions discussed with patient. If an FOBT was given today- please return it  to our front desk. If you are over 104 years old - you may need Prevnar 62 or the adult Pneumonia vaccine.  **Flu shots are available--- please call and schedule a FLU-CLINIC appointment**  After your visit with [place] today you will receive a survey in the mail or online from [employer] regarding your care with [place]. Please take a moment to fill this out. Your feedback is very important to [place] as you can help [place] better understand your patient needs as well as improve your experience and satisfaction. WE CARE ABOUT YOU!!!   Continue with Mucinex maximum strength, plain, blue and white in color and take one twice daily with cough and congestion with a large glass of water Continue to drink plenty of fluids If the sputum changes color or this does not get better within a week go ahead and start the Z-Pak which is the prescription that was given to you when he left the office today Call us back if you need to refer any problems We will call with lab work results and chest x-ray results as soon as those become available    Arrie Senate MD

## 2017-02-15 LAB — VITAMIN D 25 HYDROXY (VIT D DEFICIENCY, FRACTURES): VIT D 25 HYDROXY: 44.8 ng/mL (ref 30.0–100.0)

## 2017-02-15 LAB — BMP8+EGFR
BUN/Creatinine Ratio: 12 (ref 12–28)
BUN: 10 mg/dL (ref 8–27)
CALCIUM: 10 mg/dL (ref 8.7–10.3)
CO2: 24 mmol/L (ref 18–29)
CREATININE: 0.83 mg/dL (ref 0.57–1.00)
Chloride: 102 mmol/L (ref 96–106)
GFR, EST AFRICAN AMERICAN: 76 mL/min/{1.73_m2} (ref >59)
GFR, EST NON AFRICAN AMERICAN: 66 mL/min/{1.73_m2} (ref >59)
Glucose: 88 mg/dL (ref 65–99)
POTASSIUM: 4.5 mmol/L (ref 3.5–5.2)
Sodium: 143 mmol/L (ref 134–144)

## 2017-02-15 LAB — CBC WITH DIFFERENTIAL/PLATELET
BASOS: 1 %
Basophils Absolute: 0 10*3/uL (ref 0.0–0.2)
EOS (ABSOLUTE): 0.3 10*3/uL (ref 0.0–0.4)
EOS: 3 %
HEMATOCRIT: 44.2 % (ref 34.0–46.6)
HEMOGLOBIN: 14.8 g/dL (ref 11.1–15.9)
IMMATURE GRANS (ABS): 0 10*3/uL (ref 0.0–0.1)
Immature Granulocytes: 0 %
LYMPHS: 30 %
Lymphocytes Absolute: 2.5 10*3/uL (ref 0.7–3.1)
MCH: 30.6 pg (ref 26.6–33.0)
MCHC: 33.5 g/dL (ref 31.5–35.7)
MCV: 91 fL (ref 79–97)
Monocytes Absolute: 1 10*3/uL — ABNORMAL HIGH (ref 0.1–0.9)
Monocytes: 11 %
NEUTROS ABS: 4.7 10*3/uL (ref 1.4–7.0)
NEUTROS PCT: 55 %
PLATELETS: 250 10*3/uL (ref 150–379)
RBC: 4.84 x10E6/uL (ref 3.77–5.28)
RDW: 13.6 % (ref 12.3–15.4)
WBC: 8.5 10*3/uL (ref 3.4–10.8)

## 2017-02-15 LAB — HEPATIC FUNCTION PANEL
ALK PHOS: 69 IU/L (ref 39–117)
ALT: 17 IU/L (ref 0–32)
AST: 24 IU/L (ref 0–40)
Albumin: 4.5 g/dL (ref 3.5–4.7)
BILIRUBIN TOTAL: 0.3 mg/dL (ref 0.0–1.2)
BILIRUBIN, DIRECT: 0.06 mg/dL (ref 0.00–0.40)
Total Protein: 7.7 g/dL (ref 6.0–8.5)

## 2017-02-16 LAB — NMR, LIPOPROFILE
Cholesterol: 229 mg/dL — ABNORMAL HIGH (ref 100–199)
HDL Cholesterol by NMR: 77 mg/dL (ref >39)
HDL Particle Number: 37.6 umol/L (ref >=30.5)
LDL Particle Number: 1213 nmol/L — ABNORMAL HIGH (ref <1000)
LDL SIZE: 21 nm (ref >20.5)
LDL-C: 125 mg/dL — ABNORMAL HIGH (ref 0–99)
LP-IR SCORE: 26 (ref <=45)
SMALL LDL PARTICLE NUMBER: 481 nmol/L (ref <=527)
TRIGLYCERIDES BY NMR: 134 mg/dL (ref 0–149)

## 2017-02-24 ENCOUNTER — Encounter: Payer: Self-pay | Admitting: Family Medicine

## 2017-02-24 ENCOUNTER — Ambulatory Visit (INDEPENDENT_AMBULATORY_CARE_PROVIDER_SITE_OTHER): Payer: Medicare Other | Admitting: Family Medicine

## 2017-02-24 VITALS — BP 132/71 | HR 74 | Temp 98.2°F | Ht <= 58 in | Wt 149.0 lb

## 2017-02-24 DIAGNOSIS — D485 Neoplasm of uncertain behavior of skin: Secondary | ICD-10-CM | POA: Diagnosis not present

## 2017-02-24 DIAGNOSIS — L928 Other granulomatous disorders of the skin and subcutaneous tissue: Secondary | ICD-10-CM | POA: Diagnosis not present

## 2017-02-24 DIAGNOSIS — L989 Disorder of the skin and subcutaneous tissue, unspecified: Secondary | ICD-10-CM

## 2017-02-24 DIAGNOSIS — L918 Other hypertrophic disorders of the skin: Secondary | ICD-10-CM

## 2017-02-24 NOTE — Progress Notes (Signed)
BP 132/71   Pulse 74   Temp 98.2 F (36.8 C) (Oral)   Ht 4\' 10"  (1.473 m)   Wt 149 lb (67.6 kg)   BMI 31.14 kg/m    Subjective:    Patient ID: Maria Camacho, female    DOB: February 16, 1933, 83 y.o.   MRN: [id number]  HPI: Maria Camacho is a 83 y.o. female presenting on 02/24/2017 for Lesion removal on right side of chin (DWM has already looked at this at a previous visit, but it needs removed)   HPI Facial skin lesion and skin tag Patient is being sent to me from another provider for a lesion removal on her right chin. She also has a skin tag is been very bothersome under her right eye and gets very irritated on her bottom eyelid. SHE IS COMING TO GET THESE REMOVED. She says the main lesion that she is concerned about is the one on her chin and it has been there for less than a year. It has not changed or grown much in that year since she's had it for it is flesh-colored. The skin tag is just something she would like removed because it is so irritating and inflamed sometimes if she can.  Relevant past medical, surgical, family and social history reviewed and updated as indicated. Interim medical history since our last visit reviewed. Allergies and medications reviewed and updated.  Review of Systems  Constitutional: Negative for chills and fever.  Respiratory: Negative for chest tightness and shortness of breath.   Cardiovascular: Negative for chest pain and leg swelling.  Musculoskeletal: Negative for back pain and gait problem.  Skin: Negative for rash.  Neurological: Negative for light-headedness and headaches.  Psychiatric/Behavioral: Negative for agitation and behavioral problems.  All other systems reviewed and are negative.   Per HPI unless specifically indicated above     Objective:    BP 132/71   Pulse 74   Temp 98.2 F (36.8 C) (Oral)   Ht 4\' 10"  (1.473 m)   Wt 149 lb (67.6 kg)   BMI 31.14 kg/m   Wt Readings from Last 3 Encounters:  02/24/17 149 lb (67.6 kg)    02/14/17 146 lb (66.2 kg)  09/22/16 147 lb (66.7 kg)    Physical Exam  Constitutional: She appears well-developed and well-nourished. No distress.  Eyes: Conjunctivae are normal.  Musculoskeletal: Normal range of motion.  Neurological: She is alert. Coordination normal.  Skin: Skin is warm and dry. Lesion (Small flesh-colored 0.1 cm raised lesion on right chin near the jawline. No erythema or warmth or drainage or pigment) noted. No rash noted. She is not diaphoretic.     Psychiatric: She has a normal mood and affect. Her behavior is normal.  Nursing note and vitals reviewed.  Skin tag removal: Patient uses tweezers and simple iris scissors to cut just at the base of the skin tag, no bleeding was noted.  Shave biopsy: Shave biopsies performed of lesion on right jawline. Betadine was used for prep and patient was injected with 1.5 mL of 2% lidocaine without epinephrine. Shave biopsy was performed and compression dressing was placed with topical antibiotic after lesion was removed. Lesion was sent for biopsy. Patient tolerated procedure well bleeding was minimal.     Assessment & Plan:   Problem List Items Addressed This Visit    None    Visit Diagnoses    Facial skin lesion    -  Primary   As come up in the  past year on the right side of her chin, removed via shave biopsy, sent for pathology   Relevant Orders   Pathology (Completed)   Skin tag           Follow up plan: Return if symptoms worsen or fail to improve.  Counseling provided for all of the vaccine components No orders of the defined types were placed in this encounter.   Caryl Pina, MD [facility] 02/24/2017, 12:27 PM

## 2017-02-28 LAB — PATHOLOGY

## 2017-03-01 ENCOUNTER — Ambulatory Visit: Payer: Medicare Other | Admitting: Family Medicine

## 2017-07-02 ENCOUNTER — Other Ambulatory Visit: Payer: Self-pay | Admitting: Family Medicine

## 2017-07-14 ENCOUNTER — Encounter: Payer: Self-pay | Admitting: Family Medicine

## 2017-07-14 ENCOUNTER — Ambulatory Visit (INDEPENDENT_AMBULATORY_CARE_PROVIDER_SITE_OTHER): Payer: Medicare Other | Admitting: Family Medicine

## 2017-07-14 VITALS — BP 158/80 | HR 56 | Temp 98.8°F | Ht <= 58 in | Wt 146.0 lb

## 2017-07-14 DIAGNOSIS — E559 Vitamin D deficiency, unspecified: Secondary | ICD-10-CM | POA: Diagnosis not present

## 2017-07-14 DIAGNOSIS — E7800 Pure hypercholesterolemia, unspecified: Secondary | ICD-10-CM

## 2017-07-14 DIAGNOSIS — C649 Malignant neoplasm of unspecified kidney, except renal pelvis: Secondary | ICD-10-CM

## 2017-07-14 DIAGNOSIS — L821 Other seborrheic keratosis: Secondary | ICD-10-CM | POA: Diagnosis not present

## 2017-07-14 DIAGNOSIS — Z85528 Personal history of other malignant neoplasm of kidney: Secondary | ICD-10-CM | POA: Diagnosis not present

## 2017-07-14 DIAGNOSIS — I10 Essential (primary) hypertension: Secondary | ICD-10-CM | POA: Diagnosis not present

## 2017-07-14 NOTE — Progress Notes (Signed)
Subjective:    Patient ID: Maria Camacho, female    DOB: 1933/05/23, 83 y.o.   MRN: [id number]  HPI Pt here for follow up and management of chronic medical problems which includes hypertension and hyperlipidemia. She is taking medication regularly.The patient is doing well overall but does complain with some vertigo and does take meclizine for this. She also has a left thigh lesion that she wants [place] to look at. She will get lab work done today. She comes to the visit today with her husband. The patient also has a remote history of renal cell carcinoma and sees her urologist fairly regularly. The patient says her blood pressures at home are running in the 130s over the 60s. She denies any chest pain pressure tightness or palpitations. She denies any shortness of breath. She does have occasional problems if she eats too fast she'll have some discomfort with her hiatal hernia but no heartburn or indigestion. This is not concerning for this point in time. She is not seen any blood in the stool or had any black tarry bowel movements. She is passing her water without problems currently but has seen Dr. Jonette Eva and currently is seeing Dr. Ike Bene but has not seen him recently. They're both from me with her renal cell carcinoma which is about 12 years ago.     Patient Active Problem List   Diagnosis Date Noted  . Cystocele 02/05/2015  . Bladder prolapse, female, acquired 05/16/2014  . Vitamin D deficiency 05/16/2014  . Osteopenia 10/10/2013  . Hyperlipidemia 03/14/2013  . Hypertension 03/14/2013  . History of renal carcinoma 02/06/2013  . Encounter for routine gynecological examination 02/06/2013  . Postmenopausal vaginal bleeding 02/06/2013  . Hemorrhoid 02/06/2013   Outpatient Encounter Prescriptions as of 07/14/2017  Medication Sig  . acetaminophen (TYLENOL) 500 MG tablet Take 500 mg by mouth as needed. Reported on 12/29/2015  . amLODipine-valsartan (EXFORGE) 10-320 MG tablet TAKE (1) TABLET DAILY  AS DIRECTED.  . B Complex-C-E-Zn (BEC/ZINC) TABS Take 1 tablet by mouth daily.   . bimatoprost (LUMIGAN) 0.01 % SOLN 1 drop at bedtime.  . chlorpheniramine (ALLERGY) 4 MG tablet Take 4 mg by mouth daily as needed for allergies.  . Cholecalciferol (VITAMIN D) 2000 UNITS tablet Take 2,000 Units by mouth daily. Take 2000IU daily M-F and 4000IU Sat and Sun  . conjugated estrogens (PREMARIN) vaginal cream Place 1 g vaginally daily as needed. Reported on 03/01/2016  . fish oil-omega-3 fatty acids 1000 MG capsule Take 1 g by mouth daily.  . timolol (BETIMOL) 0.5 % ophthalmic solution 1 drop 2 (two) times daily.  . [DISCONTINUED] azithromycin (ZITHROMAX) 250 MG tablet As directed   No facility-administered encounter medications on file as of 07/14/2017.       Review of Systems  Constitutional: Negative.   HENT: Negative.   Eyes: Negative.   Respiratory: Negative.   Cardiovascular: Negative.   Gastrointestinal: Negative.   Endocrine: Negative.   Genitourinary: Negative.   Musculoskeletal: Negative.   Skin: Negative.        Left thigh lesion - red / dry  Allergic/Immunologic: Negative.   Neurological: Positive for dizziness.  Hematological: Negative.   Psychiatric/Behavioral: Negative.        Objective:   Physical Exam  Constitutional: She is oriented to person, place, and time. She appears well-developed and well-nourished.  HENT:  Head: Normocephalic and atraumatic.  Right Ear: External ear normal.  Left Ear: External ear normal.  Nose: Nose normal.  Mouth/Throat: No  oropharyngeal exudate.  Eyes: Pupils are equal, round, and reactive to light. Conjunctivae and EOM are normal. Right eye exhibits no discharge. Left eye exhibits no discharge. No scleral icterus.  Neck: Normal range of motion. Neck supple. No thyromegaly present.  No bruits thyromegaly or anterior cervical adenopathy  Cardiovascular: Normal rate, regular rhythm, normal heart sounds and intact distal pulses.   No murmur  heard. Heart is regular at 60/m  Pulmonary/Chest: Effort normal and breath sounds normal. No respiratory distress. She has no wheezes. She has no rales.  Clear anteriorly and posteriorly  Abdominal: Soft. Bowel sounds are normal. She exhibits no mass. There is no tenderness. There is no rebound and no guarding.  No abdominal tenderness masses or organ enlargement epigastric tenderness or suprapubic tenderness or inguinal adenopathy.  Musculoskeletal: Normal range of motion. She exhibits no edema.  Lymphadenopathy:    She has no cervical adenopathy.  Neurological: She is alert and oriented to person, place, and time. She has normal reflexes. No cranial nerve deficit.  Skin: Skin is warm and dry. No rash noted.  The specific lesion looked at on her left thigh appeared to be an irritated seborrheic keratosis. She will continue to use some cortisone 10 sparingly if this does not resolve the redness around the lesion she will schedule an appointment with her dermatologist for follow-up.  Psychiatric: She has a normal mood and affect. Her behavior is normal. Judgment and thought content normal.  Nursing note and vitals reviewed.  BP (!) 149/75 (BP Location: Left Arm)   Pulse (!) 56   Temp 98.8 F (37.1 C) (Oral)   Ht 4\' 10"  (1.473 m)   Wt 146 lb (66.2 kg)   BMI 30.51 kg/m   Repeat blood pressure in left arm 158/80 with a large cuff. The patient did not take her blood pressure medicine this morning.      Assessment & Plan:  1. Essential hypertension -The blood pressure was elevated on 2 occasions this morning. The patient says that her blood pressures at home and been running in the 130s over the 60s. She did not take her medicine this morning. She was instructed to take her medicine with water in the future when she comes back in blood is to be drawn. No change in treatment today.  2. Pure hypercholesterolemia -And 10 you with aggressive therapeutic lifestyle changes and omega-3 fatty  acids.  3. Vitamin D deficiency -Continue with vitamin D replacement pending results of lab work  4. History of renal carcinoma -Follow-up with urology as planned  5. Renal carcinoma, unspecified laterality ([place]) -Follow-up with urology as planned  6. Seborrheic keratosis -Continue with some cortisone 10 sparingly twice daily to the area on the left thigh and if this lesion does not resolve and the redness persists she understands to make an appointment with her dermatologist for follow-up in about 4 weeks.  Patient Instructions                       Medicare Annual Wellness Visit  [facility] and the medical providers at [facility] strive to bring you the best medical care.  In doing so we not only want to address your current medical conditions and concerns but also to detect new conditions early and prevent illness, disease and health-related problems.    Medicare offers a yearly Wellness Visit which allows our clinical staff to assess your need for preventative services including immunizations, lifestyle education, counseling to  decrease risk of preventable diseases and screening for fall risk and other medical concerns.    This visit is provided free of charge (no copay) for all Medicare recipients. The clinical pharmacists at [facility] have begun to conduct these Wellness Visits which will also include a thorough review of all your medications.    As you primary medical provider recommend that you make an appointment for your Annual Wellness Visit if you have not done so already this year.  You may set up this appointment before you leave today or you may call back ([phone]) and schedule an appointment.  Please make sure when you call that you mention that you are scheduling your Annual Wellness Visit with the clinical pharmacist so that the appointment may be made for the proper length of time.     Continue current  medications. Continue good therapeutic lifestyle changes which include good diet and exercise. Fall precautions discussed with patient. If an FOBT was given today- please return it to our front desk. If you are over 89 years old - you may need Prevnar 67 or the adult Pneumonia vaccine.  **Flu shots are available--- please call and schedule a FLU-CLINIC appointment**  After your visit with [place] today you will receive a survey in the mail or online from [employer] regarding your care with [place]. Please take a moment to fill this out. Your feedback is very important to [place] as you can help [place] better understand your patient needs as well as improve your experience and satisfaction. WE CARE ABOUT YOU!!!   Do not forget to get your flu shot Follow-up with urologist as planned for renal cell carcinoma Follow-up with gynecology as planned Monitor blood pressures closely at home and watch sodium intake Patient will schedule appointment with dermatologist in about 4 weeks if the redness around the lesion on her left thigh does not resolve. This may need to be biopsied. If she has trouble scheduling appointment she will call us.    Arrie Senate MD

## 2017-07-14 NOTE — Addendum Note (Signed)
Addended by: Zannie Cove on: 07/14/2017 10:59 AM   Modules accepted: Orders

## 2017-07-14 NOTE — Patient Instructions (Addendum)
Medicare Annual Wellness Visit  [facility] and the medical providers at [facility] strive to bring you the best medical care.  In doing so we not only want to address your current medical conditions and concerns but also to detect new conditions early and prevent illness, disease and health-related problems.    Medicare offers a yearly Wellness Visit which allows our clinical staff to assess your need for preventative services including immunizations, lifestyle education, counseling to decrease risk of preventable diseases and screening for fall risk and other medical concerns.    This visit is provided free of charge (no copay) for all Medicare recipients. The clinical pharmacists at [facility] have begun to conduct these Wellness Visits which will also include a thorough review of all your medications.    As you primary medical provider recommend that you make an appointment for your Annual Wellness Visit if you have not done so already this year.  You may set up this appointment before you leave today or you may call back ([phone]) and schedule an appointment.  Please make sure when you call that you mention that you are scheduling your Annual Wellness Visit with the clinical pharmacist so that the appointment may be made for the proper length of time.     Continue current medications. Continue good therapeutic lifestyle changes which include good diet and exercise. Fall precautions discussed with patient. If an FOBT was given today- please return it to our front desk. If you are over 81 years old - you may need Prevnar 64 or the adult Pneumonia vaccine.  **Flu shots are available--- please call and schedule a FLU-CLINIC appointment**  After your visit with [place] today you will receive a survey in the mail or online from [employer] regarding your care with [place]. Please take a moment to fill this out. Your feedback is very  important to [place] as you can help [place] better understand your patient needs as well as improve your experience and satisfaction. WE CARE ABOUT YOU!!!   Do not forget to get your flu shot Follow-up with urologist as planned for renal cell carcinoma Follow-up with gynecology as planned Monitor blood pressures closely at home and watch sodium intake Patient will schedule appointment with dermatologist in about 4 weeks if the redness around the lesion on her left thigh does not resolve. This may need to be biopsied. If she has trouble scheduling appointment she will call us.

## 2017-07-15 LAB — BMP8+EGFR
BUN / CREAT RATIO: 12 (ref 12–28)
BUN: 11 mg/dL (ref 8–27)
CALCIUM: 9.8 mg/dL (ref 8.7–10.3)
CHLORIDE: 105 mmol/L (ref 96–106)
CO2: 23 mmol/L (ref 20–29)
Creatinine, Ser: 0.9 mg/dL (ref 0.57–1.00)
GFR calc non Af Amer: 60 mL/min/{1.73_m2} (ref >59)
GFR, EST AFRICAN AMERICAN: 69 mL/min/{1.73_m2} (ref >59)
Glucose: 87 mg/dL (ref 65–99)
POTASSIUM: 4.6 mmol/L (ref 3.5–5.2)
Sodium: 143 mmol/L (ref 134–144)

## 2017-07-15 LAB — CBC WITH DIFFERENTIAL/PLATELET
BASOS: 0 %
Basophils Absolute: 0 10*3/uL (ref 0.0–0.2)
EOS (ABSOLUTE): 0.3 10*3/uL (ref 0.0–0.4)
Eos: 5 %
Hematocrit: 43.5 % (ref 34.0–46.6)
Hemoglobin: 14.2 g/dL (ref 11.1–15.9)
Immature Grans (Abs): 0 10*3/uL (ref 0.0–0.1)
Immature Granulocytes: 0 %
Lymphocytes Absolute: 3 10*3/uL (ref 0.7–3.1)
Lymphs: 40 %
MCH: 30.5 pg (ref 26.6–33.0)
MCHC: 32.6 g/dL (ref 31.5–35.7)
MCV: 94 fL (ref 79–97)
MONOS ABS: 0.8 10*3/uL (ref 0.1–0.9)
Monocytes: 10 %
NEUTROS PCT: 45 %
Neutrophils Absolute: 3.5 10*3/uL (ref 1.4–7.0)
PLATELETS: 268 10*3/uL (ref 150–379)
RBC: 4.65 x10E6/uL (ref 3.77–5.28)
RDW: 13.6 % (ref 12.3–15.4)
WBC: 7.6 10*3/uL (ref 3.4–10.8)

## 2017-07-15 LAB — VITAMIN D 25 HYDROXY (VIT D DEFICIENCY, FRACTURES): Vit D, 25-Hydroxy: 44.4 ng/mL (ref 30.0–100.0)

## 2017-07-15 LAB — LIPID PANEL
Chol/HDL Ratio: 2.9 ratio (ref 0.0–4.4)
Cholesterol, Total: 217 mg/dL — ABNORMAL HIGH (ref 100–199)
HDL: 76 mg/dL (ref >39)
LDL Calculated: 117 mg/dL — ABNORMAL HIGH (ref 0–99)
Triglycerides: 118 mg/dL (ref 0–149)
VLDL Cholesterol Cal: 24 mg/dL (ref 5–40)

## 2017-07-15 LAB — HEPATIC FUNCTION PANEL
ALT: 14 IU/L (ref 0–32)
AST: 22 IU/L (ref 0–40)
Albumin: 4.4 g/dL (ref 3.5–4.7)
Alkaline Phosphatase: 60 IU/L (ref 39–117)
BILIRUBIN TOTAL: 0.4 mg/dL (ref 0.0–1.2)
BILIRUBIN, DIRECT: 0.11 mg/dL (ref 0.00–0.40)
Total Protein: 7.1 g/dL (ref 6.0–8.5)

## 2017-08-09 ENCOUNTER — Ambulatory Visit (INDEPENDENT_AMBULATORY_CARE_PROVIDER_SITE_OTHER): Payer: Medicare Other | Admitting: *Deleted

## 2017-08-09 DIAGNOSIS — Z23 Encounter for immunization: Secondary | ICD-10-CM

## 2017-09-28 ENCOUNTER — Encounter: Payer: Self-pay | Admitting: Family Medicine

## 2017-09-28 ENCOUNTER — Ambulatory Visit: Payer: Medicare Other | Admitting: Family Medicine

## 2017-09-28 VITALS — BP 158/84 | HR 82 | Temp 97.9°F | Ht <= 58 in | Wt 150.0 lb

## 2017-09-28 DIAGNOSIS — S0003XA Contusion of scalp, initial encounter: Secondary | ICD-10-CM

## 2017-09-28 NOTE — Progress Notes (Signed)
Subjective:  Patient ID: Maria Camacho, female    DOB: October 11, 1933  Age: 83 y.o. MRN: [id number]  CC: Head Injury (pt here today c/o headache after hitting her head 6 days ago on her fridge. )   HPI Maria Camacho presents for Pain in the right forehead.  This started 2 days ago and has been increasing.  Patient stumbled forward and hit her head on the door handle of her refrigerator between 5 and 7 days ago.  She did not lose consciousness.  She did not fall she did not feel dazed.  In short she felt fine.  She does not take any blood thinners.  She does not even take over-the-counter aspirin.  There was no lesion.  Nothing occurred until 2 days ago she noted that she had some swelling at the site of impact.  She has been putting ice on it the area is located at the right brow just lateral to the bridge of her nose and to go superiorly to the hairline.  She is noted that it is been swollen and tender.  This has not affected her vision.  She is not felt dazed or confused.  It has not been overtly painful.  She has not been dizzy.  Ice has helped to relieve the swelling temporarily.  Depression screen [facility] 2/9 02/24/2017 02/14/2017 09/22/2016  Decreased Interest 0 0 0  Down, Depressed, Hopeless 0 0 0  PHQ - 2 Score 0 0 0    History Maria Camacho has a past medical history of Cancer ([place]), Cataract, Glaucoma (09/2013), Hyperlipidemia, Hypertension, Osteopenia, and Vertigo (2011).   She has a past surgical history that includes NM PET DX LYMPHOMA; Acne cyst removal (over 20 yrs. ago); Abdominal hysterectomy (1979); and Knee arthroscopy (08/10/2012).   Her family history includes COPD in her mother; Cancer in her brother, brother, brother, and father; Cancer (age of onset: 41) in her sister; Diabetes in her mother; Heart disease in her brother and mother; Macular degeneration in her mother; Obesity in her sister; Stroke in her mother.She reports that  has never smoked. she has never used smokeless tobacco. She  reports that she does not drink alcohol or use drugs.    ROS Review of Systems  Constitutional: Negative for activity change, appetite change and fever.  HENT: Negative for congestion, rhinorrhea and sore throat.   Eyes: Negative for visual disturbance.  Respiratory: Negative for cough and shortness of breath.   Cardiovascular: Negative for chest pain and palpitations.  Gastrointestinal: Negative for abdominal pain, diarrhea and nausea.  Genitourinary: Negative for dysuria.  Musculoskeletal: Negative for arthralgias and myalgias.  Neurological: Negative for dizziness, tremors, seizures, syncope, facial asymmetry, speech difficulty, weakness, light-headedness, numbness and headaches.  Psychiatric/Behavioral: Negative for agitation, confusion and hallucinations. The patient is not nervous/anxious.     Objective:  BP (!) 158/84   Pulse 82   Temp 97.9 F (36.6 C) (Oral)   Ht 4\' 10"  (1.473 m)   Wt 150 lb (68 kg)   BMI 31.35 kg/m    BP Readings from Last 3 Encounters:  09/28/17 (!) 158/84  07/14/17 (!) 158/80  02/24/17 132/71    Wt Readings from Last 3 Encounters:  09/28/17 150 lb (68 kg)  07/14/17 146 lb (66.2 kg)  02/24/17 149 lb (67.6 kg)     Physical Exam  Constitutional: She is oriented to person, place, and time. She appears well-developed and well-nourished. No distress.  HENT:  Head: Normocephalic and atraumatic.  Eyes: Conjunctivae  are normal. Pupils are equal, round, and reactive to light.  Neck: Normal range of motion. Neck supple. No thyromegaly present.  Cardiovascular: Normal rate, regular rhythm and normal heart sounds.  No murmur heard. Pulmonary/Chest: Effort normal and breath sounds normal. No respiratory distress. She has no wheezes. She has no rales.  Abdominal: Soft. Bowel sounds are normal. She exhibits no distension. There is no tenderness.  Musculoskeletal: Normal range of motion.  Lymphadenopathy:    She has no cervical adenopathy.    Neurological: She is alert and oriented to person, place, and time.  Skin: Skin is warm and dry.  Psychiatric: She has a normal mood and affect. Her behavior is normal. Judgment and thought content normal.      Assessment & Plan:   Maria Camacho was seen today for head injury.  Diagnoses and all orders for this visit:  Contusion of scalp, initial encounter    I agree that this could have impacted her with internal bleeding etc. but exam and her history just do not seem consistent with that.  We discussed some signs and symptoms she can look for at home for subdural hematoma.  There are at this time however no lateralizing signs no mental signs of confusion etc. so at this time I think reassurance is all she really needs.   I am having Jana Half A. Sweitzer maintain her Vitamin D, fish oil-omega-3 fatty acids, BEC/ZINC, conjugated estrogens, acetaminophen, amLODipine-valsartan, bimatoprost, timolol, and chlorpheniramine.  Allergies as of 09/28/2017      Reactions   Livalo [pitavastatin] Nausea Only   Penicillins    REACTION: hives   Actonel [risedronate Sodium] Other (See Comments)   dizziness   Bacitracin Rash   Oxytrol [oxybutynin] Rash      Medication List        Accurate as of 09/28/17  6:55 PM. Always use your most recent med list.          acetaminophen 500 MG tablet Commonly known as:  TYLENOL Take 500 mg by mouth as needed. Reported on 12/29/2015   ALLERGY 4 MG tablet Generic drug:  chlorpheniramine Take 4 mg by mouth daily as needed for allergies.   amLODipine-valsartan 10-320 MG tablet Commonly known as:  EXFORGE TAKE (1) TABLET DAILY AS DIRECTED.   BEC/ZINC Tabs Take 1 tablet by mouth daily.   bimatoprost 0.01 % Soln Commonly known as:  LUMIGAN 1 drop at bedtime.   fish oil-omega-3 fatty acids 1000 MG capsule Take 1 g by mouth daily.   PREMARIN vaginal cream Generic drug:  conjugated estrogens Place 1 g vaginally daily as needed. Reported on 03/01/2016    timolol 0.5 % ophthalmic solution Commonly known as:  BETIMOL 1 drop 2 (two) times daily.   Vitamin D 2000 units tablet Take 2,000 Units by mouth daily. Take 2000IU daily M-F and 4000IU Sat and Sun        Follow-up: Return if symptoms worsen or fail to improve.  Claretta Fraise, M.D.

## 2017-09-30 ENCOUNTER — Ambulatory Visit (INDEPENDENT_AMBULATORY_CARE_PROVIDER_SITE_OTHER): Payer: Medicare Other | Admitting: Family

## 2017-09-30 ENCOUNTER — Ambulatory Visit (HOSPITAL_COMMUNITY)
Admission: RE | Admit: 2017-09-30 | Discharge: 2017-09-30 | Disposition: A | Discharge status: Home / Self Care | Payer: Medicare Other | Source: Ambulatory Visit | Attending: Family | Admitting: Family

## 2017-09-30 ENCOUNTER — Encounter: Payer: Self-pay | Admitting: Family

## 2017-09-30 VITALS — BP 167/92 | HR 109 | Temp 97.0°F | Ht <= 58 in | Wt 150.6 lb

## 2017-09-30 DIAGNOSIS — S0990XD Unspecified injury of head, subsequent encounter: Secondary | ICD-10-CM | POA: Diagnosis not present

## 2017-09-30 DIAGNOSIS — R42 Dizziness and giddiness: Secondary | ICD-10-CM

## 2017-09-30 DIAGNOSIS — H05221 Edema of right orbit: Secondary | ICD-10-CM | POA: Diagnosis not present

## 2017-09-30 DIAGNOSIS — R930 Abnormal findings on diagnostic imaging of skull and head, not elsewhere classified: Secondary | ICD-10-CM | POA: Insufficient documentation

## 2017-09-30 DIAGNOSIS — Z8673 Personal history of transient ischemic attack (TIA), and cerebral infarction without residual deficits: Secondary | ICD-10-CM | POA: Diagnosis not present

## 2017-09-30 DIAGNOSIS — I6782 Cerebral ischemia: Secondary | ICD-10-CM | POA: Insufficient documentation

## 2017-09-30 DIAGNOSIS — G319 Degenerative disease of nervous system, unspecified: Secondary | ICD-10-CM | POA: Diagnosis not present

## 2017-09-30 NOTE — Patient Instructions (Signed)
Head Injury, Adult  There are many types of head injuries. Head injuries can be as minor as a bump, or they can be more severe. More severe head injuries include:   A jarring injury to the brain (concussion).   A bruise of the brain (contusion). This means there is bleeding in the brain that can cause swelling.   A cracked skull (skull fracture).   Bleeding in the brain that collects, clots, and forms a bump (hematoma).    After a head injury, you may need to be observed for a while in the emergency department or urgent care. Sometimes admission to the hospital is needed.  After a head injury has happened, most problems occur within the first 24 hours, but side effects may occur up to 7-10 days after the injury. It is important to watch your condition for any changes.  What are the causes?  There are many possible causes of a head injury. A serious head injury may happen to someone who is in a car accident (motor vehicle collision). Other causes of major head injuries include bicycle or motorcycle accidents, sports injuries, and falls.  Risk factors  This condition is more likely to occur in people who:   Drink a lot of alcohol or use drugs.   Are over the age of 65.   Are at risk for falls.    What are the symptoms?  There are many possible symptoms of a head injury. Visible symptoms of a head injury include a bruise, bump, or bleeding at the site of the injury. Other non-visible symptoms include:   Feeling sleepy or not being able to stay awake.   Passing out.   Headache.   Seizures.   Dizziness.   Confusion.   Memory problems.   Nausea or vomiting.    Other possible symptoms that may develop after the head injury include:   Poor attention and concentration.   Fatigue or tiring easily.   Irritability.   Being uncomfortable around bright lights or loud noises.   Anxiety or depression.   Disturbed sleep.    How is this diagnosed?  This condition can usually be diagnosed based on your symptoms, a  description of the injury, and a physical exam. You may also have imaging tests done, such as a CT scan or MRI. You will also be closely watched.  How is this treated?  Treatment for this condition depends on the severity and type of injury you have. The main goal of treatment is to prevent complications and allow the brain time to heal.  For mild head injury, you may be sent home and treatment may include:   Observation. A responsible adult should stay with you for 24 hours after your injury and check on you often.   Physical rest.   Brain rest.   Pain medicines.    For severe brain injury, treatment may include:   Close observation. This includes hospitalization with frequent physical exams. You may need to go to a hospital that specializes in head injury.   Pain medicines.   Breathing support. This may include using a ventilator.   Managing the pressure inside the brain (intracranial pressure, or ICP). This may include:  ? Monitoring the ICP.  ? Giving medicines to decrease the ICP.  ? Positioning you to decrease the ICP.   Medicine to prevent seizures.   Surgery to stop bleeding or to remove blood clots (craniotomy).   Surgery to remove part of the skull (decompressive   craniectomy). This allows room for the brain to swell.    Follow these instructions at home:  Activity   Rest as much as possible and avoid activities that are physically hard or tiring.   Make sure you get enough sleep.   Limit activities that require a lot of thought or attention, such as:  ? Watching TV.  ? Playing memory games and puzzles.  ? Job-related work or homework.  ? Working on the computer, social media, and texting.   Avoid activities that could cause another head injury, such as playing sports, until your health care provider approves. Having another head injury, especially before the first one has healed, can be dangerous.   Ask your health care provider when it is safe for you to return to your regular activities,  including work or school. Ask your health care provider for a step-by-step plan for gradually returning to activities.   Ask your health care provider when you can drive, ride a bicycle, or use heavy machinery. Your ability to react may be slower after a brain injury. Never do these activities if you are dizzy.    Lifestyle   Do not drink alcohol until your health care provider approves, and avoid drug use. Alcohol and certain drugs may slow your recovery and can put you at risk of further injury.   If it is harder than usual to remember things, write them down.   If you are easily distracted, try to do one thing at a time.   Talk with family members or close friends when making important decisions.   Tell your friends, family, a trusted colleague, and work manager about your injury, symptoms, and restrictions. Have them watch for any new or worsening problems.    General instructions   Take over-the-counter and prescription medicines only as told by your health care provider.   Have someone stay with you for 24 hours after your head injury. This person should watch you for any changes in your symptoms and be ready to seek medical help, as needed.   Keep all follow-up visits as told by your health care provider. This is important.    Prevention   Work on improving your balance and strength to avoid falls.   Wear a seatbelt when you are in a moving vehicle.   Wear a helmet when riding a bicycle, skiing, or doing any other sport or activity that has a risk of injury.   Drink alcohol only in moderation.   Take safety measures in your home, such as:  ? Removing clutter and tripping hazards from floors and stairways.  ? Using grab bars in bathrooms and handrails by stairs.  ? Placing non-slip mats on floors and in bathtubs.  ? Improving lighting in dim areas.  Get help right away if:   You have:  ? A severe headache that is not helped by medicine.  ? Trouble walking, have weakness in your arms and legs, or  lose your balance.  ? Clear or bloody fluid coming from your nose or ears.  ? Changes in your vision.  ? A seizure.   You vomit.   Your symptoms get worse.   Your speech is slurred.   You pass out.   You are sleepier and have trouble staying awake.   Your pupils change size.  These symptoms may represent a serious problem that is an emergency. Do not wait to see if the symptoms will go away. Get medical help right away.

## 2017-09-30 NOTE — Progress Notes (Signed)
   Subjective:    Patient ID: Maria Camacho, female    DOB: April 11, 1933, 83 y.o.   MRN: [id number]   HPI PT presents to the office today to recheck forehead and left eye swelling. PT states she hit her head on her refrigerator and was seen in the office on 09/28/17. Pt states she had redness and swelling on her forehead. However, yesterday her right eye started swelling. Pt states she just "doesn't feel right".     PT states she felt good on Monday and Tuesday, but started feeling dizzy on Thursday. Denies any headache, but the area where she hit her head is tender.  Denies any changes in speech, gait, or memory.    Review of Systems  Neurological: Positive for dizziness. Negative for weakness.  All other systems reviewed and are negative.      Objective:   Physical Exam  Constitutional: She is oriented to person, place, and time. She appears well-developed and well-nourished. No distress.  HENT:  Head: Normocephalic and atraumatic.  Right Ear: External ear normal.  Left Ear: External ear normal.  Nose: Mucosal edema and rhinorrhea present.  Mouth/Throat: Oropharynx is clear and moist.  Right eye lid swelling   Eyes: Pupils are equal, round, and reactive to light.  Neck: Normal range of motion. Neck supple. No thyromegaly present.  Cardiovascular: Normal rate, regular rhythm, normal heart sounds and intact distal pulses.  No murmur heard. Pulmonary/Chest: Effort normal and breath sounds normal. No respiratory distress. She has no wheezes.  Abdominal: Soft. Bowel sounds are normal. She exhibits no distension. There is no tenderness.  Musculoskeletal: Normal range of motion. She exhibits no edema or tenderness.  Neurological: She is alert and oriented to person, place, and time.  Skin: Skin is warm and dry.  Erythemas and slight ecchymosis on forehead    Psychiatric: She has a normal mood and affect. Her behavior is normal. Judgment and thought content normal.  Vitals  reviewed.    BP (!) 167/92   Pulse (!) 109   Temp (!) 97 F (36.1 C) (Oral)   Ht 4\' 10"  (1.473 m)   Wt 150 lb 9.6 oz (68.3 kg)   BMI 31.48 kg/m      Assessment & Plan:  1. Injury of head, subsequent encounter - CT Head Wo Contrast; Future  2. Dizziness - CT Head Wo Contrast; Future  Will order CT scan to rule out I do not believe this is a bleed, but with pt's age and new onset of dizziness (more than likely related to concussion)   Evelina Dun, FNP

## 2017-10-05 ENCOUNTER — Other Ambulatory Visit: Payer: Self-pay | Admitting: *Deleted

## 2017-10-05 MED ORDER — AMLODIPINE BESYLATE-VALSARTAN 10-320 MG PO TABS: 0.5000 | ORAL_TABLET | Freq: Every day | ORAL | 3 refills | Status: DC

## 2017-10-07 ENCOUNTER — Ambulatory Visit: Payer: Medicare Other | Admitting: Family

## 2017-10-07 ENCOUNTER — Encounter: Payer: Self-pay | Admitting: Family

## 2017-10-07 VITALS — BP 152/76 | HR 86 | Temp 100.0°F | Ht <= 58 in | Wt 149.2 lb

## 2017-10-07 DIAGNOSIS — S0003XS Contusion of scalp, sequela: Secondary | ICD-10-CM | POA: Diagnosis not present

## 2017-10-07 NOTE — Patient Instructions (Signed)
Contusion A contusion is a deep bruise. Contusions are the result of a blunt injury to tissues and muscle fibers under the skin. The injury causes bleeding under the skin. The skin overlying the contusion may turn blue, purple, or yellow. Minor injuries will give you a painless contusion, but more severe contusions may stay painful and swollen for a few weeks. What are the causes? This condition is usually caused by a blow, trauma, or direct force to an area of the body. What are the signs or symptoms? Symptoms of this condition include:  Swelling of the injured area.  Pain and tenderness in the injured area.  Discoloration. The area may have redness and then turn blue, purple, or yellow. How is this diagnosed? This condition is diagnosed based on a physical exam and medical history. An X-ray, CT scan, or MRI may be needed to determine if there are any associated injuries, such as broken bones (fractures). How is this treated? Specific treatment for this condition depends on what area of the body was injured. In general, the best treatment for a contusion is resting, icing, applying pressure to (compression), and elevating the injured area. This is often called the RICE strategy. Over-the-counter anti-inflammatory medicines may also be recommended for pain control. Follow these instructions at home:  Rest the injured area.  If directed, apply ice to the injured area:  Put ice in a plastic bag.  Place a towel between your skin and the bag.  Leave the ice on for 20 minutes, 2-3 times per day.  If directed, apply light compression to the injured area using an elastic bandage. Make sure the bandage is not wrapped too tightly. Remove and reapply the bandage as directed by your health care provider.  If possible, raise (elevate) the injured area above the level of your heart while you are sitting or lying down.  Take over-the-counter and prescription medicines only as told by your health  care provider. Contact a health care provider if:  Your symptoms do not improve after several days of treatment.  Your symptoms get worse.  You have difficulty moving the injured area. Get help right away if:  You have severe pain.  You have numbness in a hand or foot.  Your hand or foot turns pale or cold. This information is not intended to replace advice given to you by your health care provider. Make sure you discuss any questions you have with your health care provider. Document Released: 07/21/2005 Document Revised: 02/19/2016 Document Reviewed: 02/26/2015 Elsevier Interactive Patient Education  2017 [employer] Inc[employer]  

## 2017-10-07 NOTE — Progress Notes (Signed)
   Subjective:    Patient ID: Maria Camacho, female    DOB: 23-Jun-1933, 83 y.o.   MRN: [id number]  HPI PT presents to the office today with recurrent forehead pain and swelling. PT was seen on 09/28/17 and 09/30/17. PT had  CT of head that showed soft tissue swelling and negative for acute fracture.   Pt complaining of intermittent aching pain of 5 out 10 pain in the frontal skull when touched. PT states applied ice with mild relief. Pt states she has taken motrin one day and felt much better, but was scared to take any more.   Swelling in her face has greatly improved since our visit on 09/30/17. Denies any gait, speech, or vision changes.   Review of Systems  Skin: Positive for wound.  All other systems reviewed and are negative.      Objective:   Physical Exam  Constitutional: She is oriented to person, place, and time. She appears well-developed and well-nourished. No distress.  HENT:  Head: Normocephalic and atraumatic.  Right Ear: External ear normal.  Left Ear: External ear normal.  Nose: Nose normal.  Mouth/Throat: Oropharynx is clear and moist.  Eyes: Pupils are equal, round, and reactive to light.  Neck: Normal range of motion. Neck supple. No thyromegaly present.  Cardiovascular: Normal rate, regular rhythm, normal heart sounds and intact distal pulses.  No murmur heard. Pulmonary/Chest: Effort normal and breath sounds normal. No respiratory distress. She has no wheezes.  Abdominal: Soft. Bowel sounds are normal. She exhibits no distension. There is no tenderness.  Musculoskeletal: Normal range of motion. She exhibits no edema or tenderness.  Neurological: She is alert and oriented to person, place, and time.  Skin: Skin is warm and dry. Ecchymosis (present on right frontal ) noted.     Psychiatric: She has a normal mood and affect. Her behavior is normal. Judgment and thought content normal.  Vitals reviewed.    BP (!) 152/76   Pulse 86   Temp 100 F (37.8 C)  (Oral)   Ht 4\' 10"  (1.473 m)   Wt 149 lb 3.2 oz (67.7 kg)   BMI 31.18 kg/m      Assessment & Plan:  1. Contusion of scalp, sequela Rest Ice  Discussed that she can take tylenol and motrin Report any changes in speech, vision, or  gait RTO prn and keep follow up appt with PCP   Evelina Dun, FNP

## 2017-11-03 ENCOUNTER — Ambulatory Visit: Payer: Medicare Other | Admitting: Family

## 2017-11-03 ENCOUNTER — Encounter: Payer: Self-pay | Admitting: Family

## 2017-11-03 VITALS — BP 148/78 | HR 63 | Temp 98.1°F | Ht <= 58 in | Wt 151.2 lb

## 2017-11-03 DIAGNOSIS — B029 Zoster without complications: Secondary | ICD-10-CM | POA: Diagnosis not present

## 2017-11-03 MED ORDER — VALACYCLOVIR HCL 1 G PO TABS: 1000.0000 mg | ORAL_TABLET | Freq: Three times a day (TID) | ORAL | 0 refills | Status: DC

## 2017-11-03 MED ORDER — AMLODIPINE BESYLATE-VALSARTAN 10-320 MG PO TABS: 0.5000 | ORAL_TABLET | Freq: Every day | ORAL | 3 refills | Status: DC

## 2017-11-03 NOTE — Patient Instructions (Addendum)
Shingles Shingles, which is also known as herpes zoster, is an infection that causes a painful skin rash and fluid-filled blisters. Shingles is not related to genital herpes, which is a sexually transmitted infection. Shingles only develops in people who:  Have had chickenpox.  Have received the chickenpox vaccine. (This is rare.)  What are the causes? Shingles is caused by varicella-zoster virus (VZV). This is the same virus that causes chickenpox. After exposure to VZV, the virus stays in the body in an inactive (dormant) state. Shingles develops if the virus reactivates. This can happen many years after the initial exposure to VZV. It is not known what causes this virus to reactivate. What increases the risk? People who have had chickenpox or received the chickenpox vaccine are at risk for shingles. Infection is more common in people who:  Are older than age 50.  Have a weakened defense (immune) system, such as those with HIV, AIDS, or cancer.  Are taking medicines that weaken the immune system, such as transplant medicines.  Are under great stress.  What are the signs or symptoms? Early symptoms of this condition include itching, tingling, and pain in an area on your skin. Pain may be described as burning, stabbing, or throbbing. A few days or weeks after symptoms start, a painful red rash appears, usually on one side of the body in a bandlike or beltlike pattern. The rash eventually turns into fluid-filled blisters that break open, scab over, and dry up in about 2-3 weeks. At any time during the infection, you may also develop:  A fever.  Chills.  A headache.  An upset stomach.  How is this diagnosed? This condition is diagnosed with a skin exam. Sometimes, skin or fluid samples are taken from the blisters before a diagnosis is made. These samples are examined under a microscope or sent to a lab for testing. How is this treated? There is no specific cure for this condition.  Your health care provider will probably prescribe medicines to help you manage pain, recover more quickly, and avoid long-term problems. Medicines may include:  Antiviral drugs.  Anti-inflammatory drugs.  Pain medicines.  If the area involved is on your face, you may be referred to a specialist, such as an eye doctor (ophthalmologist) or an ear, nose, and throat (ENT) doctor to help you avoid eye problems, chronic pain, or disability. Follow these instructions at home: Medicines  Take medicines only as directed by your health care provider.  Apply an anti-itch or numbing cream to the affected area as directed by your health care provider. Blister and Rash Care  Take a cool bath or apply cool compresses to the area of the rash or blisters as directed by your health care provider. This may help with pain and itching.  Keep your rash covered with a loose bandage (dressing). Wear loose-fitting clothing to help ease the pain of material rubbing against the rash.  Keep your rash and blisters clean with mild soap and cool water or as directed by your health care provider.  Check your rash every day for signs of infection. These include redness, swelling, and pain that lasts or increases.  Do not pick your blisters.  Do not scratch your rash. General instructions  Rest as directed by your health care provider.  Keep all follow-up visits as directed by your health care provider. This is important.  Until your blisters scab over, your infection can cause chickenpox in people who have never had it or been vaccinated   against it. To prevent this from happening, avoid contact with other people, especially: ? Babies. ? Pregnant women. ? Children who have eczema. ? Elderly people who have transplants. ? People who have chronic illnesses, such as leukemia or AIDS. Contact a health care provider if:  Your pain is not relieved with prescribed medicines.  Your pain does not get better after  the rash heals.  Your rash looks infected. Signs of infection include redness, swelling, and pain that lasts or increases. Get help right away if:  The rash is on your face or nose.  You have facial pain, pain around your eye area, or loss of feeling on one side of your face.  You have ear pain or you have ringing in your ear.  You have loss of taste.  Your condition gets worse. This information is not intended to replace advice given to you by your health care provider. Make sure you discuss any questions you have with your health care provider. Document Released: 10/11/2005 Document Revised: 06/06/2016 Document Reviewed: 08/22/2014 Elsevier Interactive Patient Education  2018 Elsevier Inc.  

## 2017-11-03 NOTE — Progress Notes (Signed)
   Subjective:    Patient ID: Maria Camacho, female    DOB: 03-10-1933, 84 y.o.   MRN: [id number]   Rash  This is a new problem. The current episode started 1 to 4 weeks ago. The problem has been gradually improving since onset. The affected locations include the face (right side of face). The rash is characterized by blistering, itchiness, pain and redness. She was exposed to nothing. Past treatments include cold compress. The treatment provided mild relief.      Review of Systems  Skin: Positive for rash.  All other systems reviewed and are negative.      Objective:   Physical Exam  Constitutional: She is oriented to person, place, and time. She appears well-developed and well-nourished. No distress.  HENT:  Head: Normocephalic.  Eyes: Pupils are equal, round, and reactive to light.  Neck: Normal range of motion. Neck supple. No thyromegaly present.  Cardiovascular: Normal rate, regular rhythm, normal heart sounds and intact distal pulses.  No murmur heard. Pulmonary/Chest: Effort normal and breath sounds normal. No respiratory distress. She has no wheezes.  Abdominal: Soft. Bowel sounds are normal. She exhibits no distension. There is no tenderness.  Musculoskeletal: Normal range of motion. She exhibits no edema or tenderness.  Neurological: She is alert and oriented to person, place, and time.  Skin: Skin is warm and dry. Rash noted.   About 4-5 Crusted lesions over right forehead into hairline  Psychiatric: She has a normal mood and affect. Her behavior is normal. Judgment and thought content normal.  Vitals reviewed.    BP (!) 148/78   Pulse 63   Temp 98.1 F (36.7 C) (Oral)   Ht 4\' 10"  (1.473 m)   Wt 151 lb 3.2 oz (68.6 kg)   BMI 31.60 kg/m      Assessment & Plan:  1. Herpes zoster without complication Rest Avoid babies, pregnant women, and immunocompromised Do not scratch RTO prn  - valACYclovir (VALTREX) 1000 MG tablet; Take 1 tablet (1,000 mg total) by  mouth 3 (three) times daily.  Dispense: 21 tablet; Refill: 0    Evelina Dun, FNP

## 2017-12-06 ENCOUNTER — Encounter: Payer: Self-pay | Admitting: Family Medicine

## 2017-12-06 ENCOUNTER — Ambulatory Visit: Payer: Medicare Other | Admitting: Family Medicine

## 2017-12-06 ENCOUNTER — Ambulatory Visit (INDEPENDENT_AMBULATORY_CARE_PROVIDER_SITE_OTHER): Payer: Medicare Other

## 2017-12-06 VITALS — BP 139/72 | HR 59 | Temp 97.6°F | Ht <= 58 in | Wt 149.0 lb

## 2017-12-06 DIAGNOSIS — C649 Malignant neoplasm of unspecified kidney, except renal pelvis: Secondary | ICD-10-CM

## 2017-12-06 DIAGNOSIS — I10 Essential (primary) hypertension: Secondary | ICD-10-CM | POA: Diagnosis not present

## 2017-12-06 DIAGNOSIS — E559 Vitamin D deficiency, unspecified: Secondary | ICD-10-CM

## 2017-12-06 DIAGNOSIS — Z13820 Encounter for screening for osteoporosis: Secondary | ICD-10-CM

## 2017-12-06 DIAGNOSIS — E7800 Pure hypercholesterolemia, unspecified: Secondary | ICD-10-CM

## 2017-12-06 DIAGNOSIS — K219 Gastro-esophageal reflux disease without esophagitis: Secondary | ICD-10-CM | POA: Diagnosis not present

## 2017-12-06 DIAGNOSIS — M8580 Other specified disorders of bone density and structure, unspecified site: Secondary | ICD-10-CM | POA: Diagnosis not present

## 2017-12-06 DIAGNOSIS — Z780 Asymptomatic menopausal state: Secondary | ICD-10-CM

## 2017-12-06 DIAGNOSIS — B029 Zoster without complications: Secondary | ICD-10-CM

## 2017-12-06 NOTE — Patient Instructions (Addendum)
Medicare Annual Wellness Visit  [facility] and the medical providers at [facility] strive to bring you the best medical care.  In doing so we not only want to address your current medical conditions and concerns but also to detect new conditions early and prevent illness, disease and health-related problems.    Medicare offers a yearly Wellness Visit which allows our clinical staff to assess your need for preventative services including immunizations, lifestyle education, counseling to decrease risk of preventable diseases and screening for fall risk and other medical concerns.    This visit is provided free of charge (no copay) for all Medicare recipients. The clinical pharmacists at [facility] have begun to conduct these Wellness Visits which will also include a thorough review of all your medications.    As you primary medical provider recommend that you make an appointment for your Annual Wellness Visit if you have not done so already this year.  You may set up this appointment before you leave today or you may call back ([phone]) and schedule an appointment.  Please make sure when you call that you mention that you are scheduling your Annual Wellness Visit with the clinical pharmacist so that the appointment may be made for the proper length of time.     Continue current medications. Continue good therapeutic lifestyle changes which include good diet and exercise. Fall precautions discussed with patient. If an FOBT was given today- please return it to our front desk. If you are over 38 years old - you may need Prevnar 64 or the adult Pneumonia vaccine.  **Flu shots are available--- please call and schedule a FLU-CLINIC appointment**  After your visit with [place] today you will receive a survey in the mail or online from [employer] regarding your care with [place]. Please take a moment to fill this out. Your feedback is very  important to [place] as you can help [place] better understand your patient needs as well as improve your experience and satisfaction. WE CARE ABOUT YOU!!!   The patient should try taking some ranitidine regularly at least for the next month.  The equate brand by [employer] is the least expensive and can be purchased over-the-counter.  This is 150 mg and should be taken 1 twice daily before breakfast and supper for at least 1 month and then as needed after that.  It was also be good if she does take Aleve periodically to take 1 once or twice daily while taking her Aleve.  She should remember that the Aleve can irritate the stomach and raise the blood pressure. She should continue to walk and exercise regularly

## 2017-12-06 NOTE — Progress Notes (Signed)
Subjective:    Patient ID: Gweneth Dimitri, female    DOB: 02/15/1933, 84 y.o.   MRN: [id number]  HPI Pt here for follow up and management of chronic medical problems which includes hypertension and hyperlipidemia. She is taking medication regularly.  The patient has had a recent bout with shingles.  She is also concerned about her left great toe which is dark because of having hit it.  She is due to get a DEXA scan which will be done today.  She is also due to return in FOBT and get lab work.  Her vital signs are stable and her blood pressure is stable.  She is having some concerns about her husband because of his aging and memory and back issues and refusing to do things that she would like for him to do.  The patient denies any chest pain or increased shortness of breath.  She does have some additional problems with reflux and it seems to be related to her eating but she also indicates that she has had additional stress worrying about her husband shingles and the injury to the head.  She has not had any blood in the stool or black tarry bowel movements.  She has no trouble with passing her water.  She is up-to-date on her eye exam but does have cataracts.  She discussed how she injured her toe and she did see the podiatrist and this appears to be healing appropriately and may lose her great toenail on the left.  She does bring in blood pressures from the outside and these will be scanned into the record.  Some of them are on her husband.   Patient Active Problem List   Diagnosis Date Noted  . Cystocele 02/05/2015  . Bladder prolapse, female, acquired 05/16/2014  . Vitamin D deficiency 05/16/2014  . Osteopenia 10/10/2013  . Hyperlipidemia 03/14/2013  . Hypertension 03/14/2013  . History of renal carcinoma 02/06/2013  . Encounter for routine gynecological examination 02/06/2013  . Postmenopausal vaginal bleeding 02/06/2013  . Hemorrhoid 02/06/2013   Outpatient Encounter Medications as of  12/06/2017  Medication Sig  . acetaminophen (TYLENOL) 500 MG tablet Take 500 mg by mouth as needed. Reported on 12/29/2015  . amLODipine-valsartan (EXFORGE) 10-320 MG tablet Take 0.5 tablets by mouth daily.  . B Complex-C-E-Zn (BEC/ZINC) TABS Take 1 tablet by mouth daily.   . bimatoprost (LUMIGAN) 0.01 % SOLN 1 drop at bedtime.  . Cholecalciferol (VITAMIN D) 2000 UNITS tablet Take 2,000 Units by mouth daily. Take 2000IU daily M-F and 4000IU Sat and Sun  . conjugated estrogens (PREMARIN) vaginal cream Place 1 g vaginally daily as needed. Reported on 03/01/2016  . dorzolamide-timolol (COSOPT) 22.3-6.8 MG/ML ophthalmic solution 1 drop 2 (two) times daily.  . fish oil-omega-3 fatty acids 1000 MG capsule Take 1 g by mouth daily.  . chlorpheniramine (ALLERGY) 4 MG tablet Take 4 mg by mouth daily as needed for allergies.  . valACYclovir (VALTREX) 1000 MG tablet Take 1 tablet (1,000 mg total) by mouth 3 (three) times daily. (Patient not taking: Reported on 12/06/2017)   No facility-administered encounter medications on file as of 12/06/2017.       Review of Systems  Constitutional: Negative.   HENT: Negative.   Eyes: Negative.   Respiratory: Negative.   Cardiovascular: Negative.   Gastrointestinal: Negative.   Endocrine: Negative.   Genitourinary: Negative.   Musculoskeletal: Negative.   Skin: Negative.        Recent shingles - right side scalp  and over right eye  Left great toenail - dark (hit it)  Allergic/Immunologic: Negative.   Neurological: Negative.   Hematological: Negative.   Psychiatric/Behavioral: Negative.        Objective:   Physical Exam  Constitutional: She is oriented to person, place, and time. She appears well-developed and well-nourished. No distress.  Patient is pleasant and doing well and somewhat stressed  HENT:  Head: Normocephalic and atraumatic.  Right Ear: External ear normal.  Left Ear: External ear normal.  Nose: Nose normal.  Mouth/Throat: Oropharynx is  clear and moist. No oropharyngeal exudate.  Healing shingles lesion and right eyebrow  Eyes: Conjunctivae and EOM are normal. Pupils are equal, round, and reactive to light. Right eye exhibits no discharge. Left eye exhibits no discharge. No scleral icterus.  Patient is followed regularly by ophthalmologist because of cataracts  Neck: Normal range of motion. Neck supple. No thyromegaly present.  No bruits thyromegaly or anterior cervical adenopathy  Cardiovascular: Normal rate, regular rhythm, normal heart sounds and intact distal pulses.  No murmur heard. The heart is regular at 60/min  Pulmonary/Chest: Effort normal and breath sounds normal. No respiratory distress. She has no wheezes. She has no rales.  Clear anteriorly and posteriorly  Abdominal: Soft. Bowel sounds are normal. She exhibits no mass. There is no tenderness. There is no rebound and no guarding.  No abdominal tenderness masses organ enlargement bruits or inguinal adenopathy  Musculoskeletal: Normal range of motion. She exhibits no edema.  Lymphadenopathy:    She has no cervical adenopathy.  Neurological: She is alert and oriented to person, place, and time. She has normal reflexes. No cranial nerve deficit.  Skin: Skin is warm and dry. Rash noted.  Resolving facial rash and scalp rash that was minimal from the shingles.  Psychiatric: She has a normal mood and affect. Her behavior is normal. Judgment and thought content normal.  Nursing note and vitals reviewed.  BP 139/72 (BP Location: Left Arm)   Pulse (!) 59   Temp 97.6 F (36.4 C) (Oral)   Ht 4' 10"  (1.473 m)   Wt 149 lb (67.6 kg)   BMI 31.14 kg/m       Assessment & Plan:  1. Essential hypertension -The blood pressure is good in the home readings brought in were reviewed and there will be scanned into the record - BMP8+EGFR - CBC with Differential/Platelet - Hepatic function panel  2. Pure hypercholesterolemia -Continue with as aggressive therapeutic  lifestyle changes as possible and omega-3 fatty acids pending results of lab work - CBC with Differential/Platelet - Lipid panel  3. Vitamin D deficiency -Continue with vitamin D replacement pending results of lab work - DG [place]; Future - CBC with Differential/Platelet - VITAMIN D 25 Hydroxy (Vit-D Deficiency, Fractures)  4. Screening for osteoporosis -We will call with the x-ray results as soon as those results become available - DG [facility] DEXA; Future - CBC with Differential/Platelet  5. Postmenopausal -The patient does have a history of osteopenia - DG [facility] DEXA; Future - CBC with Differential/Platelet  6. Gastroesophageal reflux disease, esophagitis presence not specified -Ranitidine 150 mg twice daily before breakfast and supper and she should take this regularly for at least a month then as needed after that  7. Renal carcinoma, unspecified laterality ([place]) -Patient follows up with a urologist only as needed for this.  She did recently have a head CT scan because of the injury to the head and before the break out of the shingles and  the CT scan according to the patient was stable.  8. Osteopenia, unspecified location -DEXA scan today  9. Herpes zoster without complication -This appears to be resolving without any postherpetic neuralgia of any consequence.  Patient Instructions                       Medicare Annual Wellness Visit  [facility] and the medical providers at [facility] strive to bring you the best medical care.  In doing so we not only want to address your current medical conditions and concerns but also to detect new conditions early and prevent illness, disease and health-related problems.    Medicare offers a yearly Wellness Visit which allows our clinical staff to assess your need for preventative services including immunizations, lifestyle education, counseling to decrease risk of preventable diseases and screening for fall risk  and other medical concerns.    This visit is provided free of charge (no copay) for all Medicare recipients. The clinical pharmacists at [facility] have begun to conduct these Wellness Visits which will also include a thorough review of all your medications.    As you primary medical provider recommend that you make an appointment for your Annual Wellness Visit if you have not done so already this year.  You may set up this appointment before you leave today or you may call back ([phone]) and schedule an appointment.  Please make sure when you call that you mention that you are scheduling your Annual Wellness Visit with the clinical pharmacist so that the appointment may be made for the proper length of time.     Continue current medications. Continue good therapeutic lifestyle changes which include good diet and exercise. Fall precautions discussed with patient. If an FOBT was given today- please return it to our front desk. If you are over 14 years old - you may need Prevnar 88 or the adult Pneumonia vaccine.  **Flu shots are available--- please call and schedule a FLU-CLINIC appointment**  After your visit with [place] today you will receive a survey in the mail or online from [employer] regarding your care with [place]. Please take a moment to fill this out. Your feedback is very important to [place] as you can help [place] better understand your patient needs as well as improve your experience and satisfaction. WE CARE ABOUT YOU!!!   The patient should try taking some ranitidine regularly at least for the next month.  The equate brand by [employer] is the least expensive and can be purchased over-the-counter.  This is 150 mg and should be taken 1 twice daily before breakfast and supper for at least 1 month and then as needed after that.  It was also be good if she does take Aleve periodically to take 1 once or twice daily while taking her Aleve.  She should remember that the Aleve can  irritate the stomach and raise the blood pressure. She should continue to walk and exercise regularly   Arrie Senate MD

## 2017-12-07 LAB — BMP8+EGFR
BUN / CREAT RATIO: 17 (ref 12–28)
BUN: 14 mg/dL (ref 8–27)
CHLORIDE: 102 mmol/L (ref 96–106)
CO2: 24 mmol/L (ref 20–29)
Calcium: 9.4 mg/dL (ref 8.7–10.3)
Creatinine, Ser: 0.83 mg/dL (ref 0.57–1.00)
GFR calc non Af Amer: 66 mL/min/{1.73_m2} (ref >59)
GFR, EST AFRICAN AMERICAN: 76 mL/min/{1.73_m2} (ref >59)
GLUCOSE: 88 mg/dL (ref 65–99)
Potassium: 4.3 mmol/L (ref 3.5–5.2)
SODIUM: 142 mmol/L (ref 134–144)

## 2017-12-07 LAB — HEPATIC FUNCTION PANEL
ALBUMIN: 4.3 g/dL (ref 3.5–4.7)
ALT: 20 IU/L (ref 0–32)
AST: 22 IU/L (ref 0–40)
Alkaline Phosphatase: 60 IU/L (ref 39–117)
Bilirubin Total: 0.4 mg/dL (ref 0.0–1.2)
Bilirubin, Direct: 0.13 mg/dL (ref 0.00–0.40)
TOTAL PROTEIN: 7.5 g/dL (ref 6.0–8.5)

## 2017-12-07 LAB — CBC WITH DIFFERENTIAL/PLATELET
BASOS ABS: 0 10*3/uL (ref 0.0–0.2)
Basos: 1 %
EOS (ABSOLUTE): 0.3 10*3/uL (ref 0.0–0.4)
EOS: 4 %
HEMATOCRIT: 43.3 % (ref 34.0–46.6)
HEMOGLOBIN: 14.2 g/dL (ref 11.1–15.9)
Immature Grans (Abs): 0 10*3/uL (ref 0.0–0.1)
Immature Granulocytes: 0 %
LYMPHS ABS: 2.7 10*3/uL (ref 0.7–3.1)
Lymphs: 36 %
MCH: 30.7 pg (ref 26.6–33.0)
MCHC: 32.8 g/dL (ref 31.5–35.7)
MCV: 94 fL (ref 79–97)
MONOCYTES: 9 %
MONOS ABS: 0.7 10*3/uL (ref 0.1–0.9)
Neutrophils Absolute: 3.6 10*3/uL (ref 1.4–7.0)
Neutrophils: 50 %
Platelets: 256 10*3/uL (ref 150–379)
RBC: 4.63 x10E6/uL (ref 3.77–5.28)
RDW: 13.9 % (ref 12.3–15.4)
WBC: 7.3 10*3/uL (ref 3.4–10.8)

## 2017-12-07 LAB — LIPID PANEL
CHOL/HDL RATIO: 2.8 ratio (ref 0.0–4.4)
Cholesterol, Total: 212 mg/dL — ABNORMAL HIGH (ref 100–199)
HDL: 75 mg/dL (ref >39)
LDL CALC: 119 mg/dL — AB (ref 0–99)
Triglycerides: 90 mg/dL (ref 0–149)
VLDL Cholesterol Cal: 18 mg/dL (ref 5–40)

## 2017-12-07 LAB — VITAMIN D 25 HYDROXY (VIT D DEFICIENCY, FRACTURES): VIT D 25 HYDROXY: 49.6 ng/mL (ref 30.0–100.0)

## 2017-12-13 ENCOUNTER — Other Ambulatory Visit: Payer: Medicare Other

## 2017-12-13 DIAGNOSIS — Z1211 Encounter for screening for malignant neoplasm of colon: Secondary | ICD-10-CM

## 2017-12-15 LAB — FECAL OCCULT BLOOD, IMMUNOCHEMICAL: Fecal Occult Bld: NEGATIVE

## 2018-01-19 ENCOUNTER — Ambulatory Visit (INDEPENDENT_AMBULATORY_CARE_PROVIDER_SITE_OTHER): Payer: Medicare Other | Admitting: *Deleted

## 2018-01-19 VITALS — BP 138/79 | HR 66 | Temp 97.9°F | Ht <= 58 in | Wt 149.0 lb

## 2018-01-19 DIAGNOSIS — Z0000 Encounter for general adult medical examination without abnormal findings: Secondary | ICD-10-CM | POA: Diagnosis not present

## 2018-01-19 NOTE — Patient Instructions (Signed)
  Ms. Sexson , Thank you for taking time to come for your Medicare Wellness Visit. I appreciate your ongoing commitment to your health goals. Please review the following plan we discussed and let me know if I can assist you in the future.   These are the goals we discussed: Goals    . Prevent falls     Stay active and exercise        This is a list of the screening recommended for you and due dates:  Health Maintenance  Topic Date Due  . Mammogram  11/23/2019  . DEXA scan (bone density measurement)  12/07/2019  . Tetanus Vaccine  02/22/2021  . Flu Shot  Completed  . Pneumonia vaccines  Completed   Keep follow up with Dr Laurance Flatten and other specialist. We will get a EKG at your next visit with Dr Laurance Flatten Be care to not put yourself at risk for falls.

## 2018-01-19 NOTE — Progress Notes (Addendum)
Subjective:   Maria Camacho is a very active 84 y.o. female who presents for Medicare Annual (Subsequent) preventive examination. She is married and lives at home with her husband. She has 3 sons. 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 She is very active in the community and is currently involved with the [employer]. She enjoys reading and exercise. For exercise, she goes to the recreation center or she walks 3-4 times per week. She states that diet is pretty healthy. She does not have any pets and fall risk were discussed today.   Cardiac Risk Factors include: advanced age (>3men, >60 women);dyslipidemia;hypertension     Objective:     Vitals: BP 138/79 (BP Location: Left Arm)   Pulse 66   Temp 97.9 F (36.6 C) (Oral)   Ht 4\' 10"  (1.473 m)   Wt 149 lb (67.6 kg)   BMI 31.14 kg/m   Body mass index is 31.14 kg/m.  Advanced Directives 12/29/2015 11/25/2014 08/10/2012  Does Patient Have a Medical Advance Directive? Yes Yes Patient has advance directive, copy not in chart  Type of Advance Directive Living will;Healthcare Power of [place];Living will [place];Living will  Does patient want to make changes to medical advance directive? No - Patient declined - -  Copy of [place] in Chart? No - copy requested No - copy requested Copy requested from family  Pre-existing out of facility DNR order (yellow form or pink MOST form) - - No    Tobacco Social History   Tobacco Use  Smoking Status Never Smoker  Smokeless Tobacco Never Used     Counseling given: Not Answered   Clinical Intake:                       Past Medical History:  Diagnosis Date  . Allergy    seasonal   . Cancer ([place])    kidney right  . Cataract    see opth note from 09/2013  . Glaucoma 09/2013   open angle, low risk  . Hyperlipidemia   . Hypertension   . Osteopenia    dexa 09/2013  .  Vertigo 2011   Past Surgical History:  Procedure Laterality Date  . ABDOMINAL HYSTERECTOMY  1979  . ACNE CYST REMOVAL  over 20 yrs. ago   Fatty tiisue of neck  . KNEE ARTHROSCOPY  08/10/2012   Procedure: ARTHROSCOPY KNEE;  Surgeon: Johnn Hai, MD;  Location: [facility];  Service: Orthopedics;  Laterality: Left;  WITH DEBRIDEMENT  . NM PET DX LYMPHOMA    . right kidney cancer surgery  2006   Family History  Problem Relation Age of Onset  . Diabetes Mother   . Heart disease Mother        stent and stroke  . COPD Mother   . Macular degeneration Mother   . Stroke Mother   . Cancer Father        stomach  . Cancer Sister 14       ?uterine  . Obesity Sister   . Cancer Brother   . Cancer Brother   . Heart disease Brother   . Cancer Brother        lung  . Cancer Maternal Grandmother        unknown   Social History   Socioeconomic History  . Marital status: Married    Spouse name:  Not on file  . Number of children: Not on file  . Years of education: Not on file  . Highest education level: Not on file  Occupational History  . Not on file  Social Needs  . Financial resource strain: Not on file  . Food insecurity:    Worry: Not on file    Inability: Not on file  . Transportation needs:    Medical: Not on file    Non-medical: Not on file  Tobacco Use  . Smoking status: Never Smoker  . Smokeless tobacco: Never Used  Substance and Sexual Activity  . Alcohol use: No  . Drug use: No  . Sexual activity: Never  Lifestyle  . Physical activity:    Days per week: Not on file    Minutes per session: Not on file  . Stress: Not on file  Relationships  . Social connections:    Talks on phone: Not on file    Gets together: Not on file    Attends religious service: Not on file    Active member of club or organization: Not on file    Attends meetings of clubs or organizations: Not on file    Relationship status: Not on file  Other Topics Concern  . Not on file  Social  History Narrative   Lives at home with husband - married almost 40 years  (2019)    Outpatient Encounter Medications as of 01/19/2018  Medication Sig  . acetaminophen (TYLENOL) 500 MG tablet Take 500 mg by mouth as needed. Reported on 12/29/2015  . amLODipine-valsartan (EXFORGE) 10-320 MG tablet Take 0.5 tablets by mouth daily.  . B Complex-C-E-Zn (BEC/ZINC) TABS Take 1 tablet by mouth daily.   . bimatoprost (LUMIGAN) 0.01 % SOLN 1 drop at bedtime.  . chlorpheniramine (ALLERGY) 4 MG tablet Take 4 mg by mouth daily as needed for allergies.  . Cholecalciferol (VITAMIN D) 2000 UNITS tablet Take 2,000 Units by mouth daily. Take 2000IU daily M-F and 4000IU Sat and Sun  . conjugated estrogens (PREMARIN) vaginal cream Place 1 g vaginally daily as needed. Reported on 03/01/2016  . dorzolamide-timolol (COSOPT) 22.3-6.8 MG/ML ophthalmic solution 1 drop daily.   . fish oil-omega-3 fatty acids 1000 MG capsule Take 1 g by mouth daily.  . [DISCONTINUED] valACYclovir (VALTREX) 1000 MG tablet Take 1 tablet (1,000 mg total) by mouth 3 (three) times daily. (Patient not taking: Reported on 12/06/2017)   No facility-administered encounter medications on file as of 01/19/2018.     Activities of Daily Living In your present state of health, do you have any difficulty performing the following activities: 01/19/2018  Hearing? N  Vision? Y  Comment wears RX glasses  Difficulty concentrating or making decisions? N  Walking or climbing stairs? N  Dressing or bathing? N  Doing errands, shopping? N  Preparing Food and eating ? N  Using the Toilet? N  In the past six months, have you accidently leaked urine? N  Do you have problems with loss of bowel control? N  Managing your Medications? N  Managing your Finances? N  Housekeeping or managing your Housekeeping? N  Some recent data might be hidden    Patient Care Team: Chipper Herb, MD as PCP - General (Family Medicine) Kerry Kass, MD as Referring  Physician (Surgery) Christy Sartorius, MD as Referring Physician (Urology) Marygrace Drought, MD as Consulting Physician (Ophthalmology)    Assessment:   This is a routine wellness examination for Maria Camacho.  Exercise Activities  and Dietary recommendations Current Exercise Habits: Home exercise routine, Type of exercise: walking, Time (Minutes): 60, Frequency (Times/Week): 4, Weekly Exercise (Minutes/Week): 240, Intensity: Mild, Exercise limited by: None identified  Goals    . Prevent falls     Stay active and exercise        Fall Risk Fall Risk  01/19/2018 12/06/2017 11/03/2017 10/07/2017 09/30/2017  Falls in the past year? No No No No No  Number falls in past yr: - - - - -  Injury with Fall? - - - - -  Comment - - - - -   Is the patient's home free of loose throw rugs in walkways, pet beds, electrical cords, etc?  Fall risks and hazards were discussed today.  Depression Screen PHQ 2/9 Scores 01/19/2018 12/06/2017 11/03/2017 10/07/2017  PHQ - 2 Score 0 0 0 0     Cognitive Function MMSE - Mini Mental State Exam 01/19/2018 12/29/2015  Orientation to time 5 5  Orientation to Place 5 5  Registration 3 3  Attention/ Calculation 5 5  Recall 3 3  Language- name 2 objects 2 2  Language- repeat 1 1  Language- follow 3 step command 3 3  Language- read & follow direction 1 1  Write a sentence 1 1  Copy design 1 1  Total score 30 30        Immunization History  Administered Date(s) Administered  . Influenza, High Dose Seasonal PF 07/29/2016, 08/09/2017  . Influenza,inj,Quad PF,6+ Mos 07/26/2013, 07/30/2014, 07/30/2015  . Pneumococcal Conjugate-13 12/26/2013  . Pneumococcal Polysaccharide-23 07/26/2007  . Tdap 02/23/2011    Qualifies for Shingles Vaccine?she had the zostavax and id currently not interested in the Shingrix. She recently had shingles in Jan 2019.  Screening Tests Health Maintenance  Topic Date Due  . MAMMOGRAM  11/23/2019  . DEXA SCAN  12/07/2019  . TETANUS/TDAP   02/22/2021  . INFLUENZA VACCINE  Completed  . PNA vac Low Risk Adult  Completed    Cancer Screenings: Lung: Low Dose CT Chest recommended if Age 34-80 years, 30 pack-year currently smoking OR have quit w/in 15years. Patient does qualify. Breast:  Up to date on Mammogram? Yes   Up to date of Bone Density/Dexa? Yes Colorectal: up to date (11/2017)  Additional Screenings: denied Hepatitis B/HIV/Syphillis: Hepatitis C Screening:      Plan:   pt is to keep follow up with Dr Laurance Flatten and other specialist. She will get an EKG at the next OV with [facility] We discussed shingrix today and she is not interested at this time.  I have personally reviewed and noted the following in the patient's chart:   . Medical and social history . Use of alcohol, tobacco or illicit drugs  . Current medications and supplements . Functional ability and status . Nutritional status . Physical activity . Advanced directives . List of other physicians . Hospitalizations, surgeries, and ER visits in previous 12 months . Vitals . Screenings to include cognitive, depression, and falls . Referrals and appointments  In addition, I have reviewed and discussed with patient certain preventive protocols, quality metrics, and best practice recommendations. A written personalized care plan for preventive services as well as general preventive health recommendations were provided to patient.     Karsen Nakanishi, Cameron Proud, LPN  03/03/2584  I have reviewed and agree with the above AWV documentation.   Mary-Margaret Hassell Done, FNP

## 2018-02-06 ENCOUNTER — Other Ambulatory Visit: Payer: Self-pay | Admitting: Family Medicine

## 2018-04-20 ENCOUNTER — Other Ambulatory Visit: Payer: Medicare Other

## 2018-04-20 DIAGNOSIS — I10 Essential (primary) hypertension: Secondary | ICD-10-CM

## 2018-04-20 DIAGNOSIS — E7800 Pure hypercholesterolemia, unspecified: Secondary | ICD-10-CM

## 2018-04-20 DIAGNOSIS — E559 Vitamin D deficiency, unspecified: Secondary | ICD-10-CM

## 2018-04-20 DIAGNOSIS — K219 Gastro-esophageal reflux disease without esophagitis: Secondary | ICD-10-CM

## 2018-04-21 LAB — CBC WITH DIFFERENTIAL/PLATELET
BASOS: 1 %
Basophils Absolute: 0.1 10*3/uL (ref 0.0–0.2)
EOS (ABSOLUTE): 0.3 10*3/uL (ref 0.0–0.4)
EOS: 5 %
HEMATOCRIT: 44.7 % (ref 34.0–46.6)
HEMOGLOBIN: 14.7 g/dL (ref 11.1–15.9)
Immature Grans (Abs): 0 10*3/uL (ref 0.0–0.1)
Immature Granulocytes: 1 %
LYMPHS ABS: 2.5 10*3/uL (ref 0.7–3.1)
Lymphs: 37 %
MCH: 30.1 pg (ref 26.6–33.0)
MCHC: 32.9 g/dL (ref 31.5–35.7)
MCV: 92 fL (ref 79–97)
MONOCYTES: 9 %
Monocytes Absolute: 0.6 10*3/uL (ref 0.1–0.9)
NEUTROS ABS: 3.2 10*3/uL (ref 1.4–7.0)
Neutrophils: 47 %
PLATELETS: 255 10*3/uL (ref 150–450)
RBC: 4.88 x10E6/uL (ref 3.77–5.28)
RDW: 12.6 % (ref 12.3–15.4)
WBC: 6.6 10*3/uL (ref 3.4–10.8)

## 2018-04-21 LAB — BMP8+EGFR
BUN/Creatinine Ratio: 17 (ref 12–28)
BUN: 14 mg/dL (ref 8–27)
CO2: 19 mmol/L — ABNORMAL LOW (ref 20–29)
CREATININE: 0.83 mg/dL (ref 0.57–1.00)
Calcium: 9.5 mg/dL (ref 8.7–10.3)
Chloride: 106 mmol/L (ref 96–106)
GFR calc non Af Amer: 66 mL/min/{1.73_m2} (ref >59)
GFR, EST AFRICAN AMERICAN: 76 mL/min/{1.73_m2} (ref >59)
Glucose: 82 mg/dL (ref 65–99)
Potassium: 5.1 mmol/L (ref 3.5–5.2)
Sodium: 143 mmol/L (ref 134–144)

## 2018-04-21 LAB — HEPATIC FUNCTION PANEL
ALT: 16 IU/L (ref 0–32)
AST: 22 IU/L (ref 0–40)
Albumin: 4.4 g/dL (ref 3.5–4.7)
Alkaline Phosphatase: 60 IU/L (ref 39–117)
BILIRUBIN, DIRECT: 0.12 mg/dL (ref 0.00–0.40)
Bilirubin Total: 0.4 mg/dL (ref 0.0–1.2)
Total Protein: 7.5 g/dL (ref 6.0–8.5)

## 2018-04-21 LAB — VITAMIN D 25 HYDROXY (VIT D DEFICIENCY, FRACTURES): VIT D 25 HYDROXY: 49.4 ng/mL (ref 30.0–100.0)

## 2018-04-21 LAB — LIPID PANEL
CHOL/HDL RATIO: 2.9 ratio (ref 0.0–4.4)
Cholesterol, Total: 224 mg/dL — ABNORMAL HIGH (ref 100–199)
HDL: 77 mg/dL (ref >39)
LDL Calculated: 126 mg/dL — ABNORMAL HIGH (ref 0–99)
Triglycerides: 105 mg/dL (ref 0–149)
VLDL CHOLESTEROL CAL: 21 mg/dL (ref 5–40)

## 2018-04-26 ENCOUNTER — Other Ambulatory Visit: Payer: Self-pay | Admitting: *Deleted

## 2018-04-26 ENCOUNTER — Encounter: Payer: Self-pay | Admitting: Family Medicine

## 2018-04-26 ENCOUNTER — Ambulatory Visit: Payer: Medicare Other | Admitting: Family Medicine

## 2018-04-26 VITALS — BP 131/77 | HR 74 | Temp 98.2°F | Ht <= 58 in | Wt 148.0 lb

## 2018-04-26 DIAGNOSIS — K219 Gastro-esophageal reflux disease without esophagitis: Secondary | ICD-10-CM

## 2018-04-26 DIAGNOSIS — I10 Essential (primary) hypertension: Secondary | ICD-10-CM | POA: Diagnosis not present

## 2018-04-26 DIAGNOSIS — E7800 Pure hypercholesterolemia, unspecified: Secondary | ICD-10-CM

## 2018-04-26 DIAGNOSIS — E559 Vitamin D deficiency, unspecified: Secondary | ICD-10-CM

## 2018-04-26 MED ORDER — TERBINAFINE HCL 1 % EX CREA: 1.0000 "application " | TOPICAL_CREAM | Freq: Every day | CUTANEOUS | 1 refills | Status: DC

## 2018-04-26 NOTE — Progress Notes (Signed)
Subjective:    Patient ID: Maria Camacho, female    DOB: [birth year], 84 y.o.   MRN: [id number]  HPI  Pt here for follow up and management of chronic medical problems which includes hypertension and hyperlipidemia. She is taking medication regularly.  The patient is continuing to have some ongoing problems with looking after her husband who has declined with his memory more since the last visit.  She has had lab work done and this will be reviewed with her during the visit today.  The LDL C cholesterol remains elevated at 126.  The good cholesterol is excellent at 77 triglycerides are good at 105.  In the past she is refused to take any medicine for this.  The blood sugar creatinine and all electrolytes were normal except the CO2 was slightly decreased.  The CBC had a good hemoglobin that was stable at 14.7 normal white count and adequate platelet count.  The vitamin D level was excellent at 49.4.  All liver function tests were normal.  Patient has a long history of having elevated LDL-C cholesterol but still refuses to take any medication for this she will continue with her diet and exercise regimen.  Today she denies any chest pain pressure tightness palpitations or shortness of breath.  She denies any trouble with swallowing heartburn indigestion nausea vomiting diarrhea blood in the stool or change in bowel habits.  She is passing her water without problems.  She does have a history of a cystocele and has not found the need to go back and see her urologist recently but can call him if she starts having problems again.  She has been visiting her ophthalmologist in [place] and does have glaucoma and cataracts and will see him again soon.  We did discuss the worsening problems with her husband and his memory and he is going to be seen after her today.  This obviously adds more stress to her life and worry.  She does have sons that are very helpful and getting her husband out and this takes some of the  stress off of her.  We also discussed her family history and she is the only sibling living and there are 4 other ones and all of them died with some type of cancer or heart disease but she cannot be specific about the cancer.   Patient Active Problem List   Diagnosis Date Noted  . Cystocele 02/05/2015  . Bladder prolapse, female, acquired 05/16/2014  . Vitamin D deficiency 05/16/2014  . Osteopenia 10/10/2013  . Hyperlipidemia 03/14/2013  . Hypertension 03/14/2013  . History of renal carcinoma 02/06/2013  . Encounter for routine gynecological examination 02/06/2013  . Postmenopausal vaginal bleeding 02/06/2013  . Hemorrhoid 02/06/2013   Outpatient Encounter Medications as of 04/26/2018  Medication Sig  . acetaminophen (TYLENOL) 500 MG tablet Take 500 mg by mouth as needed. Reported on 12/29/2015  . amLODipine-valsartan (EXFORGE) 10-320 MG tablet Take 0.5 tablets by mouth daily.  . B Complex-C-E-Zn (BEC/ZINC) TABS Take 1 tablet by mouth daily.   . bimatoprost (LUMIGAN) 0.01 % SOLN 1 drop at bedtime.  . chlorpheniramine (ALLERGY) 4 MG tablet Take 4 mg by mouth daily as needed for allergies.  . Cholecalciferol (VITAMIN D) 2000 UNITS tablet Take 2,000 Units by mouth daily. Take 2000IU daily M-F and 4000IU Sat and Sun  . conjugated estrogens (PREMARIN) vaginal cream Place 1 g vaginally daily as needed. Reported on 03/01/2016  . dorzolamide-timolol (COSOPT) 22.3-6.8 MG/ML ophthalmic solution 1 drop daily.   Marland Kitchen  fish oil-omega-3 fatty acids 1000 MG capsule Take 1 g by mouth daily.  . meclizine (ANTIVERT) 25 MG tablet TAKE 1 TABLET THREE TIMES DAILY WITH MEALS AS NEEDED FOR DIZZINESS   No facility-administered encounter medications on file as of 04/26/2018.       Review of Systems  Constitutional: Negative.   HENT: Negative.   Eyes: Negative.   Respiratory: Negative.   Cardiovascular: Negative.   Gastrointestinal: Negative.   Endocrine: Negative.   Genitourinary: Negative.   Musculoskeletal:  Negative.   Skin: Negative.        Right side residual pain from shingles  Allergic/Immunologic: Negative.   Neurological: Negative.   Hematological: Negative.   Psychiatric/Behavioral: Negative.        Objective:   Physical Exam  Constitutional: She is oriented to person, place, and time. She appears well-developed and well-nourished.  Patient is pleasant and relaxed despite her husband's mental decline.  HENT:  Head: Normocephalic and atraumatic.  Right Ear: External ear normal.  Left Ear: External ear normal.  Nose: Nose normal.  Mouth/Throat: Oropharynx is clear and moist. No oropharyngeal exudate.  Eyes: Pupils are equal, round, and reactive to light. Conjunctivae and EOM are normal. Right eye exhibits no discharge. Left eye exhibits no discharge. No scleral icterus.  Neck: Normal range of motion. Neck supple. No thyromegaly present.  No thyromegaly bruits or adenopathy  Cardiovascular: Normal rate, regular rhythm, normal heart sounds and intact distal pulses.  No murmur heard. Heart is regular at 72/min with good pedal pulses  Pulmonary/Chest: Effort normal and breath sounds normal. She has no wheezes. She has no rales.  Lungs are clear anteriorly and posteriorly  Abdominal: Soft. Bowel sounds are normal. She exhibits no mass. There is no tenderness.  Minimal generalized abdominal tenderness with no liver or spleen enlargement and no suprapubic tenderness and no inguinal adenopathy  Musculoskeletal: Normal range of motion. She exhibits no edema or deformity.  Lymphadenopathy:    She has no cervical adenopathy.  Neurological: She is alert and oriented to person, place, and time. She has normal reflexes. No cranial nerve deficit.  Reflexes are 2+ and equal bilaterally  Skin: Skin is warm and dry. No rash noted.  Psychiatric: She has a normal mood and affect. Her behavior is normal. Judgment and thought content normal.  The patient's mood affect and behavior and judgment are  all normal.  Nursing note and vitals reviewed.   BP 131/77 (BP Location: Left Arm)   Pulse 74   Temp 98.2 F (36.8 C) (Oral)   Ht 4\' 10"  (1.473 m)   Wt 148 lb (67.1 kg)   BMI 30.93 kg/m        Assessment & Plan:  1. Pure hypercholesterolemia -The LDL C remains elevated but patient refuses to take any amount of statin drug even once weekly.  She will continue with her aggressive therapeutic lifestyle changes including diet and exercise  2. Essential hypertension -The blood pressure is good and she will continue with current treatment  3. Vitamin D deficiency -The vitamin D level is good and she will continue with current treatment  4. Gastroesophageal reflux disease, esophagitis presence not specified -He rarely has any reflux symptoms and does not take anything for this but knows the medicine to take if she has any problems.  Patient Instructions                       Medicare Annual Wellness Visit  [facility] and  the medical providers at [facility] strive to bring you the best medical care.  In doing so we not only want to address your current medical conditions and concerns but also to detect new conditions early and prevent illness, disease and health-related problems.    Medicare offers a yearly Wellness Visit which allows our clinical staff to assess your need for preventative services including immunizations, lifestyle education, counseling to decrease risk of preventable diseases and screening for fall risk and other medical concerns.    This visit is provided free of charge (no copay) for all Medicare recipients. The clinical pharmacists at [facility] have begun to conduct these Wellness Visits which will also include a thorough review of all your medications.    As you primary medical provider recommend that you make an appointment for your Annual Wellness Visit if you have not done so already this year.  You may set up  this appointment before you leave today or you may call back ([phone]) and schedule an appointment.  Please make sure when you call that you mention that you are scheduling your Annual Wellness Visit with the clinical pharmacist so that the appointment may be made for the proper length of time.     Continue current medications. Continue good therapeutic lifestyle changes which include good diet and exercise. Fall precautions discussed with patient. If an FOBT was given today- please return it to our front desk. If you are over 72 years old - you may need Prevnar 42 or the adult Pneumonia vaccine.  **Flu shots are available--- please call and schedule a FLU-CLINIC appointment**  After your visit with [place] today you will receive a survey in the mail or online from [employer] regarding your care with [place]. Please take a moment to fill this out. Your feedback is very important to [place] as you can help [place] better understand your patient needs as well as improve your experience and satisfaction. WE CARE ABOUT YOU!!!   Stay active physically Drink plenty of water and stay well-hydrated Take 1 day at a time Encourage your sons to keep your husband busy and stimulated  Arrie Senate MD

## 2018-04-26 NOTE — Patient Instructions (Addendum)
Medicare Annual Wellness Visit  [facility] and the medical providers at [facility] strive to bring you the best medical care.  In doing so we not only want to address your current medical conditions and concerns but also to detect new conditions early and prevent illness, disease and health-related problems.    Medicare offers a yearly Wellness Visit which allows our clinical staff to assess your need for preventative services including immunizations, lifestyle education, counseling to decrease risk of preventable diseases and screening for fall risk and other medical concerns.    This visit is provided free of charge (no copay) for all Medicare recipients. The clinical pharmacists at [facility] have begun to conduct these Wellness Visits which will also include a thorough review of all your medications.    As you primary medical provider recommend that you make an appointment for your Annual Wellness Visit if you have not done so already this year.  You may set up this appointment before you leave today or you may call back ([phone]) and schedule an appointment.  Please make sure when you call that you mention that you are scheduling your Annual Wellness Visit with the clinical pharmacist so that the appointment may be made for the proper length of time.     Continue current medications. Continue good therapeutic lifestyle changes which include good diet and exercise. Fall precautions discussed with patient. If an FOBT was given today- please return it to our front desk. If you are over 46 years old - you may need Prevnar 49 or the adult Pneumonia vaccine.  **Flu shots are available--- please call and schedule a FLU-CLINIC appointment**  After your visit with [place] today you will receive a survey in the mail or online from [employer] regarding your care with [place]. Please take a moment to fill this out. Your feedback is very  important to [place] as you can help [place] better understand your patient needs as well as improve your experience and satisfaction. WE CARE ABOUT YOU!!!   Stay active physically Drink plenty of water and stay well-hydrated Take 1 day at a time Encourage your sons to keep your husband busy and stimulated

## 2018-04-26 NOTE — Addendum Note (Signed)
Addended by: Zannie Cove on: 04/26/2018 10:00 AM   Modules accepted: Orders

## 2018-04-26 NOTE — Addendum Note (Signed)
Addended by: Zannie Cove on: 04/26/2018 08:49 AM   Modules accepted: Orders

## 2018-07-11 NOTE — Progress Notes (Signed)
Cardiology Office Note   Date:  07/12/2018   ID:  Maria Camacho, DOB 1932/12/04, MRN [id number]  PCP:  Chipper Herb, MD  Cardiologist:   No primary care provider on file. Referring:  Chipper Herb, MD  Chief Complaint  Patient presents with  . Abnormal ECG      History of Present Illness: Maria Camacho is a 84 y.o. female who is referred by Chipper Herb, MD for evaluation of an abnormal EKG  She has dyslipidemia but no other past cardiac history.  She has not had any prior cardiac testing.  She takes care of her house and her husband who has memory difficulties.  He does some exercises at the recreation center. The patient denies any new symptoms such as chest discomfort, neck or arm discomfort. There has been no new shortness of breath, PND or orthopnea. There have been no reported palpitations, presyncope or syncope.  However, she is found to have inferior Q waves on an EKG.  These do meet criteria for possible old inferior infarct and are more pronounced than on previous EKGs.  I reviewed one from 2013.   Past Medical History:  Diagnosis Date  . Allergy    seasonal   . Cancer ([place])    kidney right  . Cataract    see opth note from 09/2013  . Glaucoma 09/2013   open angle, low risk  . Hyperlipidemia   . Hypertension   . Osteopenia    dexa 09/2013  . Vertigo 2011    Past Surgical History:  Procedure Laterality Date  . ABDOMINAL HYSTERECTOMY  1979  . ACNE CYST REMOVAL  over 20 yrs. ago   Fatty tiisue of neck  . KNEE ARTHROSCOPY  08/10/2012   Procedure: ARTHROSCOPY KNEE;  Surgeon: Johnn Hai, MD;  Location: [facility];  Service: Orthopedics;  Laterality: Left;  WITH DEBRIDEMENT  . NM PET DX LYMPHOMA    . right kidney cancer surgery  2006     Current Outpatient Medications  Medication Sig Dispense Refill  . acetaminophen (TYLENOL) 500 MG tablet Take 500 mg by mouth as needed. Reported on 12/29/2015    . amLODipine-valsartan (EXFORGE) 10-320 MG tablet Take  0.5 tablets by mouth daily. 45 tablet 3  . B Complex-C-E-Zn (BEC/ZINC) TABS Take 1 tablet by mouth daily.     . bimatoprost (LUMIGAN) 0.01 % SOLN Place 1 drop into both eyes at bedtime.     . chlorpheniramine (ALLERGY) 4 MG tablet Take 4 mg by mouth daily as needed for allergies.    . Cholecalciferol (VITAMIN D) 2000 UNITS tablet Take 2,000 Units by mouth daily. Take 2000IU daily M-F and 4000IU Sat and Sun    . conjugated estrogens (PREMARIN) vaginal cream Place 1 g vaginally daily as needed. Reported on 03/01/2016    . dorzolamide-timolol (COSOPT) 22.3-6.8 MG/ML ophthalmic solution Place 1 drop into both eyes daily.     . fish oil-omega-3 fatty acids 1000 MG capsule Take 1 g by mouth daily.     No current facility-administered medications for this visit.     Allergies:   Actonel [risedronate sodium]; Bacitracin; Livalo [pitavastatin]; Oxytrol [oxybutynin]; and Penicillins    Social History:  The patient  reports that she has never smoked. She has never used smokeless tobacco. She reports that she does not drink alcohol or use drugs.   Family History:  The patient's family history includes COPD in her mother; Cancer in her brother, brother, brother,  father, and maternal grandmother; Cancer (age of onset: 83) in her sister; Diabetes in her mother; Heart disease in her brother and mother; Macular degeneration in her mother; Obesity in her sister; Stroke in her mother.   She does not know the details of her mother's cardiac history but it does not seem that there were any early onset heart attacks in first-degree relatives.   ROS:  Please see the history of present illness.   Otherwise, review of systems are positive for dizziness, reflux, leg cramping, mild edema..   All other systems are reviewed and negative.    PHYSICAL EXAM: VS:  BP 137/76   Pulse 68   Ht 4\' 10"  (1.473 m)   Wt 149 lb (67.6 kg)   BMI 31.14 kg/m  , BMI Body mass index is 31.14 kg/m. GENERAL:  Well appearing HEENT:   Pupils equal round and reactive, fundi not visualized, oral mucosa unremarkable NECK:  No jugular venous distention, waveform within normal limits, carotid upstroke brisk and symmetric, no bruits, no thyromegaly LYMPHATICS:  No cervical, inguinal adenopathy LUNGS:  Clear to auscultation bilaterally BACK:  No CVA tenderness CHEST:  Unremarkable HEART:  PMI not displaced or sustained,S1 and S2 within normal limits, no S3, no S4, no clicks, no rubs, no murmurs ABD:  Flat, positive bowel sounds normal in frequency in pitch, no bruits, no rebound, no guarding, no midline pulsatile mass, no hepatomegaly, no splenomegaly EXT:  2 plus pulses throughout, no edema, no cyanosis no clubbing SKIN:  No rashes no nodules NEURO:  Cranial nerves II through XII grossly intact, motor grossly intact throughout PSYCH:  Cognitively intact, oriented to person place and time    EKG:  EKG is not ordered today. The ekg ordered 04/26/18 demonstrates sinus rhythm, rate 65, axis within normal limits, intervals within normal limits, RSR prime V1 and V2, possible old inferior infarct.   Recent Labs: 04/20/2018: ALT 16; BUN 14; Creatinine, Ser 0.83; Hemoglobin 14.7; Platelets 255; Potassium 5.1; Sodium 143    Lipid Panel    Component Value Date/Time   CHOL 224 (H) 04/20/2018 0830   CHOL 228 (H) 03/02/2013 0858   TRIG 105 04/20/2018 0830   TRIG 134 02/14/2017 0933   TRIG 132 03/02/2013 0858   HDL 77 04/20/2018 0830   HDL 77 02/14/2017 0933   HDL 78 03/02/2013 0858   CHOLHDL 2.9 04/20/2018 0830   LDLCALC 126 (H) 04/20/2018 0830   LDLCALC 117 (H) 05/16/2014 0935   LDLCALC 124 (H) 03/02/2013 0858      Wt Readings from Last 3 Encounters:  07/12/18 149 lb (67.6 kg)  04/26/18 148 lb (67.1 kg)  01/19/18 149 lb (67.6 kg)      Other studies Reviewed: Additional studies/ records that were reviewed today include: Labs and EKG. Review of the above records demonstrates:  Please see elsewhere in the note.      ASSESSMENT AND PLAN:  ABNORMAL EKG:    This could represent a previous old infarct.  She has cardiovascular risk factors.  I would like to check an echocardiogram and if there is no wall motion abnormalities I would likely not pursue more aggressive risk factor modification.  In light she has some high risk findings, since she is asymptomatic, I would likely not pursue further testing.  HTN:   The blood pressure is at target. No change in medications is indicated. We will continue with therapeutic lifestyle changes (TLC).   DYSLIPIDEMIA:    She is reluctant to try other  medications.  Goals of therapy would be based on whether we think she is had her Rosanne Gutting disease in the past based on the above studies.   Current medicines are reviewed at length with the patient today.  The patient does not have concerns regarding medicines.  The following changes have been made:  no change  Labs/ tests ordered today include:   Orders Placed This Encounter  Procedures  . ECHOCARDIOGRAM COMPLETE     Disposition:   FU with me as needed     Signed, Minus Breeding, MD  07/12/2018 12:36 PM    [place]

## 2018-07-12 ENCOUNTER — Encounter: Payer: Self-pay | Admitting: Cardiology

## 2018-07-12 ENCOUNTER — Ambulatory Visit: Payer: Medicare Other | Admitting: Cardiology

## 2018-07-12 VITALS — BP 137/76 | HR 68 | Ht <= 58 in | Wt 149.0 lb

## 2018-07-12 DIAGNOSIS — E785 Hyperlipidemia, unspecified: Secondary | ICD-10-CM | POA: Diagnosis not present

## 2018-07-12 DIAGNOSIS — R9431 Abnormal electrocardiogram [ECG] [EKG]: Secondary | ICD-10-CM

## 2018-07-12 DIAGNOSIS — I10 Essential (primary) hypertension: Secondary | ICD-10-CM | POA: Diagnosis not present

## 2018-07-12 NOTE — Patient Instructions (Signed)
Medication Instructions:  The current medical regimen is effective;  continue present plan and medications.  Testing/Procedures: Your physician has requested that you have an echocardiogram. Echocardiography is a painless test that uses sound waves to create images of your heart. It provides your doctor with information about the size and shape of your heart and how well your heart's chambers and valves are working. This procedure takes approximately one hour. There are no restrictions for this procedure.  Follow-Up: Follow up as needed after the above testing.  Thank you for choosing [facility]!!     

## 2018-07-20 ENCOUNTER — Ambulatory Visit (HOSPITAL_COMMUNITY)
Admission: RE | Admit: 2018-07-20 | Discharge: 2018-07-20 | Disposition: A | Discharge status: Home / Self Care | Payer: Medicare Other | Source: Ambulatory Visit | Attending: Cardiology | Admitting: Cardiology

## 2018-07-20 DIAGNOSIS — R9431 Abnormal electrocardiogram [ECG] [EKG]: Secondary | ICD-10-CM

## 2018-07-20 DIAGNOSIS — I340 Nonrheumatic mitral (valve) insufficiency: Secondary | ICD-10-CM | POA: Insufficient documentation

## 2018-07-20 DIAGNOSIS — I509 Heart failure, unspecified: Secondary | ICD-10-CM | POA: Insufficient documentation

## 2018-07-20 DIAGNOSIS — E785 Hyperlipidemia, unspecified: Secondary | ICD-10-CM | POA: Insufficient documentation

## 2018-07-20 DIAGNOSIS — I110 Hypertensive heart disease with heart failure: Secondary | ICD-10-CM | POA: Diagnosis not present

## 2018-07-20 NOTE — Progress Notes (Signed)
*  PRELIMINARY RESULTS* Echocardiogram 2D Echocardiogram has been performed.  Maria Camacho 07/20/2018, 3:51 PM

## 2018-07-25 ENCOUNTER — Telehealth: Payer: Self-pay | Admitting: Cardiology

## 2018-07-25 NOTE — Telephone Encounter (Signed)
Pt advised echo results 

## 2018-07-25 NOTE — Telephone Encounter (Signed)
Noted! Thank you

## 2018-07-25 NOTE — Telephone Encounter (Signed)
Follow up   Pt returning call for nurse regarding echo results

## 2018-08-02 ENCOUNTER — Ambulatory Visit (INDEPENDENT_AMBULATORY_CARE_PROVIDER_SITE_OTHER): Payer: Medicare Other

## 2018-08-02 DIAGNOSIS — Z23 Encounter for immunization: Secondary | ICD-10-CM | POA: Diagnosis not present

## 2018-09-22 ENCOUNTER — Other Ambulatory Visit: Payer: Medicare Other

## 2018-09-22 DIAGNOSIS — E7800 Pure hypercholesterolemia, unspecified: Secondary | ICD-10-CM

## 2018-09-22 DIAGNOSIS — I10 Essential (primary) hypertension: Secondary | ICD-10-CM

## 2018-09-22 DIAGNOSIS — E559 Vitamin D deficiency, unspecified: Secondary | ICD-10-CM

## 2018-09-23 LAB — CBC WITH DIFFERENTIAL/PLATELET
Basophils Absolute: 0.1 10*3/uL (ref 0.0–0.2)
Basos: 1 %
EOS (ABSOLUTE): 0.3 10*3/uL (ref 0.0–0.4)
Eos: 3 %
Hematocrit: 44.5 % (ref 34.0–46.6)
Hemoglobin: 14.6 g/dL (ref 11.1–15.9)
Immature Grans (Abs): 0 10*3/uL (ref 0.0–0.1)
Immature Granulocytes: 0 %
Lymphocytes Absolute: 3.6 10*3/uL — ABNORMAL HIGH (ref 0.7–3.1)
Lymphs: 42 %
MCH: 30.7 pg (ref 26.6–33.0)
MCHC: 32.8 g/dL (ref 31.5–35.7)
MCV: 94 fL (ref 79–97)
Monocytes Absolute: 0.8 10*3/uL (ref 0.1–0.9)
Monocytes: 9 %
NEUTROS ABS: 3.7 10*3/uL (ref 1.4–7.0)
Neutrophils: 45 %
Platelets: 268 10*3/uL (ref 150–450)
RBC: 4.75 x10E6/uL (ref 3.77–5.28)
RDW: 12.1 % — ABNORMAL LOW (ref 12.3–15.4)
WBC: 8.5 10*3/uL (ref 3.4–10.8)

## 2018-09-23 LAB — BMP8+EGFR
BUN/Creatinine Ratio: 16 (ref 12–28)
BUN: 15 mg/dL (ref 8–27)
CALCIUM: 9.5 mg/dL (ref 8.7–10.3)
CHLORIDE: 104 mmol/L (ref 96–106)
CO2: 22 mmol/L (ref 20–29)
Creatinine, Ser: 0.94 mg/dL (ref 0.57–1.00)
GFR calc Af Amer: 65 mL/min/{1.73_m2} (ref >59)
GFR calc non Af Amer: 56 mL/min/{1.73_m2} — ABNORMAL LOW (ref >59)
GLUCOSE: 89 mg/dL (ref 65–99)
Potassium: 4.6 mmol/L (ref 3.5–5.2)
Sodium: 141 mmol/L (ref 134–144)

## 2018-09-23 LAB — HEPATIC FUNCTION PANEL
ALK PHOS: 64 IU/L (ref 39–117)
ALT: 17 IU/L (ref 0–32)
AST: 23 IU/L (ref 0–40)
Albumin: 4.4 g/dL (ref 3.5–4.7)
Bilirubin Total: <0.2 mg/dL (ref 0.0–1.2)
Bilirubin, Direct: 0.07 mg/dL (ref 0.00–0.40)
Total Protein: 7.2 g/dL (ref 6.0–8.5)

## 2018-09-23 LAB — LIPID PANEL
Chol/HDL Ratio: 2.6 ratio (ref 0.0–4.4)
Cholesterol, Total: 205 mg/dL — ABNORMAL HIGH (ref 100–199)
HDL: 80 mg/dL (ref >39)
LDL Calculated: 109 mg/dL — ABNORMAL HIGH (ref 0–99)
Triglycerides: 82 mg/dL (ref 0–149)
VLDL Cholesterol Cal: 16 mg/dL (ref 5–40)

## 2018-09-23 LAB — VITAMIN D 25 HYDROXY (VIT D DEFICIENCY, FRACTURES): Vit D, 25-Hydroxy: 42.3 ng/mL (ref 30.0–100.0)

## 2018-09-26 ENCOUNTER — Encounter: Payer: Self-pay | Admitting: Family Medicine

## 2018-09-26 ENCOUNTER — Ambulatory Visit: Payer: Medicare Other | Admitting: Family Medicine

## 2018-09-26 VITALS — BP 133/74 | HR 73 | Temp 98.4°F | Ht <= 58 in | Wt 149.0 lb

## 2018-09-26 DIAGNOSIS — H01133 Eczematous dermatitis of right eye, unspecified eyelid: Secondary | ICD-10-CM

## 2018-09-26 DIAGNOSIS — E559 Vitamin D deficiency, unspecified: Secondary | ICD-10-CM

## 2018-09-26 DIAGNOSIS — K219 Gastro-esophageal reflux disease without esophagitis: Secondary | ICD-10-CM

## 2018-09-26 DIAGNOSIS — E7800 Pure hypercholesterolemia, unspecified: Secondary | ICD-10-CM

## 2018-09-26 DIAGNOSIS — I10 Essential (primary) hypertension: Secondary | ICD-10-CM

## 2018-09-26 DIAGNOSIS — C649 Malignant neoplasm of unspecified kidney, except renal pelvis: Secondary | ICD-10-CM

## 2018-09-26 MED ORDER — ESTROGENS, CONJUGATED 0.625 MG/GM VA CREA: 1.0000 g | TOPICAL_CREAM | Freq: Every day | VAGINAL | 3 refills | Status: DC | PRN

## 2018-09-26 MED ORDER — AMLODIPINE BESYLATE-VALSARTAN 10-320 MG PO TABS: 0.5000 | ORAL_TABLET | Freq: Every day | ORAL | 3 refills | Status: DC

## 2018-09-26 NOTE — Patient Instructions (Addendum)
Medicare Annual Wellness Visit  [facility] and the medical providers at [facility] strive to bring you the best medical care.  In doing so we not only want to address your current medical conditions and concerns but also to detect new conditions early and prevent illness, disease and health-related problems.    Medicare offers a yearly Wellness Visit which allows our clinical staff to assess your need for preventative services including immunizations, lifestyle education, counseling to decrease risk of preventable diseases and screening for fall risk and other medical concerns.    This visit is provided free of charge (no copay) for all Medicare recipients. The clinical pharmacists at [facility] have begun to conduct these Wellness Visits which will also include a thorough review of all your medications.    As you primary medical provider recommend that you make an appointment for your Annual Wellness Visit if you have not done so already this year.  You may set up this appointment before you leave today or you may call back ([phone]) and schedule an appointment.  Please make sure when you call that you mention that you are scheduling your Annual Wellness Visit with the clinical pharmacist so that the appointment may be made for the proper length of time.     Continue current medications. Continue good therapeutic lifestyle changes which include good diet and exercise. Fall precautions discussed with patient. If an FOBT was given today- please return it to our front desk. If you are over 38 years old - you may need Prevnar 44 or the adult Pneumonia vaccine.  **Flu shots are available--- please call and schedule a FLU-CLINIC appointment**  After your visit with [place] today you will receive a survey in the mail or online from [employer] regarding your care with [place]. Please take a moment to fill this out. Your feedback is very  important to [place] as you can help [place] better understand your patient needs as well as improve your experience and satisfaction. WE CARE ABOUT YOU!!!   Follow-up with urology as planned. Stay active physically and drink plenty of water and fluids Follow-up with podiatrist For about a week or so applied a very small amount of cortisone 10 to the upper right eyelid as discussed Avoid soaps fabric softeners and detergents that are scented

## 2018-09-26 NOTE — Progress Notes (Signed)
Subjective:    Patient ID: Maria Camacho, female    DOB: [birth year], 84 y.o.   MRN: [id number]  HPI Pt here for follow up and management of chronic medical problems which includes hypertension and hyperlipidemia. She is taking medication regularly.  The patient is in with her regular follow-up for hypertension and hyperlipidemia.  She is continued to have problems with a cystocele has an appointment this Friday for follow-up of that.  She did have an injury to her left middle finger.  The right eyelid is itching and she has several skin tags.  She is requesting refills on all of her medicines.  Her vital signs are stable.  A repeat blood pressure was 127/72.  She has had lab work done and this will be reviewed with her during the visit today.  Her vitamin D level was good at 42.3.  The blood sugar was normal at 89 and the creatinine remains stable at 0.94 and all electrolytes including potassium are good.  All liver function tests are normal.  The CBC had a normal white blood cell count a good hemoglobin at 14.6 and an adequate platelet count.  Cholesterol numbers with traditional lipid testing have an LDL-C that is slightly improved but still elevated at 109.  The good cholesterol remains excellent at 80 and triglycerides are good at 82.  Patient has had some problems with toenails on both feet and is currently seeing the podiatrist but she is getting better with this.  She did see the cardiologist recently and he told her that everything was good and stable with her cardiac situation.  She denies any chest pain pressure tightness or shortness of breath.  She denies any trouble with swallowing heartburn indigestion nausea vomiting diarrhea blood in the stool or black tarry bowel movements or change in bowel habits.  She is passing her water but it is very slow and it is taking a long time for it to pass and she is feeling uncomfortable in her lower abdomen.  She is seeing Dr. Ike Bene this Friday and will discuss  any surgery that may be needed to help her emptying better.   Patient Active Problem List   Diagnosis Date Noted  . Abnormal EKG 07/12/2018  . Dyslipidemia 07/12/2018  . Cystocele 02/05/2015  . Bladder prolapse, female, acquired 05/16/2014  . Vitamin D deficiency 05/16/2014  . Osteopenia 10/10/2013  . Hyperlipidemia 03/14/2013  . Hypertension 03/14/2013  . History of renal carcinoma 02/06/2013  . Encounter for routine gynecological examination 02/06/2013  . Postmenopausal vaginal bleeding 02/06/2013  . Hemorrhoid 02/06/2013   Outpatient Encounter Medications as of 09/26/2018  Medication Sig  . acetaminophen (TYLENOL) 500 MG tablet Take 500 mg by mouth as needed. Reported on 12/29/2015  . amLODipine-valsartan (EXFORGE) 10-320 MG tablet Take 0.5 tablets by mouth daily.  . B Complex-C-E-Zn (BEC/ZINC) TABS Take 1 tablet by mouth daily.   . bimatoprost (LUMIGAN) 0.01 % SOLN Place 1 drop into both eyes at bedtime.   . chlorpheniramine (ALLERGY) 4 MG tablet Take 4 mg by mouth daily as needed for allergies.  . Cholecalciferol (VITAMIN D) 2000 UNITS tablet Take 2,000 Units by mouth daily. Take 2000IU daily M-F and 4000IU Sat and Sun  . conjugated estrogens (PREMARIN) vaginal cream Place 1 g vaginally daily as needed. Reported on 03/01/2016  . dorzolamide-timolol (COSOPT) 22.3-6.8 MG/ML ophthalmic solution Place 1 drop into both eyes daily.   . fish oil-omega-3 fatty acids 1000 MG capsule Take 1 g by mouth  daily.   No facility-administered encounter medications on file as of 09/26/2018.      Review of Systems  Constitutional: Negative.   HENT: Negative.   Eyes: Negative.        Right eye lid itching (shingles there in past)  Respiratory: Negative.   Cardiovascular: Negative.   Gastrointestinal: Negative.   Endocrine: Negative.   Genitourinary: Negative.   Musculoskeletal: Negative.        Left middle finger wound - mashed last week  Skin: Negative.        Skin tags -several     Allergic/Immunologic: Negative.   Neurological: Negative.   Hematological: Negative.   Psychiatric/Behavioral: Negative.        Objective:   Physical Exam  Constitutional: She is oriented to person, place, and time. She appears well-developed and well-nourished. No distress.  Is pleasant and alert.  HENT:  Head: Normocephalic and atraumatic.  Right Ear: External ear normal.  Left Ear: External ear normal.  Nose: Nose normal.  Mouth/Throat: Oropharynx is clear and moist. No oropharyngeal exudate.  Eyes: Pupils are equal, round, and reactive to light. Conjunctivae and EOM are normal. Right eye exhibits no discharge. Left eye exhibits no discharge. No scleral icterus.  Dry skin above right eyelid  Neck: Normal range of motion. Neck supple. No thyromegaly present.  No bruits thyromegaly or anterior cervical adenopathy  Cardiovascular: Normal rate, regular rhythm, normal heart sounds and intact distal pulses.  No murmur heard. The heart is regular at 72/min with good pedal pulses and no edema  Pulmonary/Chest: Effort normal and breath sounds normal. She has no wheezes. She has no rales.  Clear anteriorly and posteriorly  Abdominal: Soft. Bowel sounds are normal. She exhibits no mass. There is no tenderness.  Musculoskeletal: Normal range of motion. She exhibits no edema.  Lymphadenopathy:    She has no cervical adenopathy.  Neurological: She is alert and oriented to person, place, and time. She has normal reflexes. No cranial nerve deficit. Coordination normal.  Reflexes are 2+ and equal bilaterally in lower extremities  Skin: Skin is warm and dry. Rash noted. There is erythema.  Mild atopic dermatitis right upper eyelid and erythema of both great toe secondary to ingrown nails which is getting better.  Psychiatric: She has a normal mood and affect. Her behavior is normal. Judgment and thought content normal.  The patient's mood affect and behavior are all normal for her.  Nursing  note and vitals reviewed.  BP 133/74 (BP Location: Left Arm)   Pulse 73   Temp 98.4 F (36.9 C) (Oral)   Ht 4\' 10"  (1.473 m)   Wt 149 lb (67.6 kg)   BMI 31.14 kg/m         Assessment & Plan:  1. Pure hypercholesterolemia -Continue with aggressive therapeutic lifestyle changes including diet and exercise  2. Essential hypertension -Blood pressure is good electrolytes are good she will continue with current treatment and all efforts to keep weight down as low as possible  3. Vitamin D deficiency -Continue with vitamin D replacement  4. Gastroesophageal reflux disease, esophagitis presence not specified -No complaints today with reflux  5. Renal carcinoma, unspecified laterality ([place]) -Follow-up with urology as planned  6. Atopic dermatitis of eyelid, right -Avoid soaps fabric softeners and detergents that are scented and use a small amount of cortisone 10 to the right eyelid at bedtime for 1 week.  Meds ordered this encounter  Medications  . amLODipine-valsartan (EXFORGE) 10-320 MG tablet  Sig: Take 0.5 tablets by mouth daily.    Dispense:  45 tablet    Refill:  3  . conjugated estrogens (PREMARIN) vaginal cream    Sig: Place 0.5 Applicatorfuls vaginally daily as needed. Reported on 03/01/2016    Dispense:  42.5 g    Refill:  3   Patient Instructions                       Medicare Annual Wellness Visit  [facility] and the medical providers at [facility] strive to bring you the best medical care.  In doing so we not only want to address your current medical conditions and concerns but also to detect new conditions early and prevent illness, disease and health-related problems.    Medicare offers a yearly Wellness Visit which allows our clinical staff to assess your need for preventative services including immunizations, lifestyle education, counseling to decrease risk of preventable diseases and screening for fall risk and other medical  concerns.    This visit is provided free of charge (no copay) for all Medicare recipients. The clinical pharmacists at [facility] have begun to conduct these Wellness Visits which will also include a thorough review of all your medications.    As you primary medical provider recommend that you make an appointment for your Annual Wellness Visit if you have not done so already this year.  You may set up this appointment before you leave today or you may call back ([phone]) and schedule an appointment.  Please make sure when you call that you mention that you are scheduling your Annual Wellness Visit with the clinical pharmacist so that the appointment may be made for the proper length of time.     Continue current medications. Continue good therapeutic lifestyle changes which include good diet and exercise. Fall precautions discussed with patient. If an FOBT was given today- please return it to our front desk. If you are over 56 years old - you may need Prevnar 50 or the adult Pneumonia vaccine.  **Flu shots are available--- please call and schedule a FLU-CLINIC appointment**  After your visit with [place] today you will receive a survey in the mail or online from [employer] regarding your care with [place]. Please take a moment to fill this out. Your feedback is very important to [place] as you can help [place] better understand your patient needs as well as improve your experience and satisfaction. WE CARE ABOUT YOU!!!   Follow-up with urology as planned. Stay active physically and drink plenty of water and fluids Follow-up with podiatrist For about a week or so applied a very small amount of cortisone 10 to the upper right eyelid as discussed Avoid soaps fabric softeners and detergents that are scented  Arrie Senate MD

## 2018-11-07 ENCOUNTER — Ambulatory Visit: Payer: Medicare Other | Admitting: Family Medicine

## 2018-11-07 ENCOUNTER — Other Ambulatory Visit: Payer: Self-pay | Admitting: Family Medicine

## 2018-11-07 ENCOUNTER — Encounter: Payer: Self-pay | Admitting: Family Medicine

## 2018-11-07 VITALS — BP 154/76 | HR 60 | Temp 97.6°F | Ht <= 58 in | Wt 149.0 lb

## 2018-11-07 DIAGNOSIS — K0889 Other specified disorders of teeth and supporting structures: Secondary | ICD-10-CM

## 2018-11-07 DIAGNOSIS — L989 Disorder of the skin and subcutaneous tissue, unspecified: Secondary | ICD-10-CM

## 2018-11-07 DIAGNOSIS — L820 Inflamed seborrheic keratosis: Secondary | ICD-10-CM | POA: Diagnosis not present

## 2018-11-07 MED ORDER — AZITHROMYCIN 250 MG PO TABS: ORAL_TABLET | ORAL | 0 refills | Status: DC

## 2018-11-07 NOTE — Addendum Note (Signed)
Addended by: Zannie Cove on: 11/07/2018 12:48 PM   Modules accepted: Orders

## 2018-11-07 NOTE — Progress Notes (Signed)
Subjective:    Patient ID: Maria Camacho, female    DOB: Jun 05, 1933, 85 y.o.   MRN: [id number]  HPI Patient here today for several skin lesion removals.  Patient has a lesion on her abdomen under the arm and on her thigh.  Today she also wants [place] to look at veins on her leg and left wrist and complains of tooth pain on the top on her left side.  She is using Tylenol now.  Her vital signs are good except her blood pressure is elevated today.  The patient has a history of renal cell carcinoma cataracts glaucoma hyperlipidemia and hypertension.  She relates no other problems today.   Patient Active Problem List   Diagnosis Date Noted  . Abnormal EKG 07/12/2018  . Dyslipidemia 07/12/2018  . Cystocele 02/05/2015  . Bladder prolapse, female, acquired 05/16/2014  . Vitamin D deficiency 05/16/2014  . Osteopenia 10/10/2013  . Hyperlipidemia 03/14/2013  . Hypertension 03/14/2013  . History of renal carcinoma 02/06/2013  . Encounter for routine gynecological examination 02/06/2013  . Postmenopausal vaginal bleeding 02/06/2013  . Hemorrhoid 02/06/2013   Outpatient Encounter Medications as of 11/07/2018  Medication Sig  . acetaminophen (TYLENOL) 500 MG tablet Take 500 mg by mouth as needed. Reported on 12/29/2015  . amLODipine-valsartan (EXFORGE) 10-320 MG tablet Take 0.5 tablets by mouth daily.  . B Complex-C-E-Zn (BEC/ZINC) TABS Take 1 tablet by mouth daily.   . bimatoprost (LUMIGAN) 0.01 % SOLN Place 1 drop into both eyes at bedtime.   . chlorpheniramine (ALLERGY) 4 MG tablet Take 4 mg by mouth daily as needed for allergies.  . Cholecalciferol (VITAMIN D) 2000 UNITS tablet Take 2,000 Units by mouth daily. Take 2000IU daily M-F and 4000IU Sat and Sun  . conjugated estrogens (PREMARIN) vaginal cream Place 0.5 Applicatorfuls vaginally daily as needed. Reported on 03/01/2016  . dorzolamide-timolol (COSOPT) 22.3-6.8 MG/ML ophthalmic solution Place 1 drop into both eyes daily.   . fish oil-omega-3  fatty acids 1000 MG capsule Take 1 g by mouth daily.   No facility-administered encounter medications on file as of 11/07/2018.       Review of Systems  Constitutional: Negative.   HENT: Negative.        Tooth pain - left upper   Eyes: Negative.   Respiratory: Negative.   Cardiovascular: Negative.   Gastrointestinal: Negative.   Endocrine: Negative.   Genitourinary: Negative.   Musculoskeletal: Negative.   Skin: Negative.        3 skin lesions = 1-abd, 1 right axilla, 1 left thigh  Raised veins   Allergic/Immunologic: Negative.   Neurological: Negative.   Hematological: Negative.   Psychiatric/Behavioral: Negative.        Objective:   Physical Exam Vitals signs and nursing note reviewed.  Constitutional:      Appearance: Normal appearance. She is well-developed. She is obese. She is not ill-appearing.     Comments: Patient alert and pleasant  HENT:     Head: Normocephalic and atraumatic.     Right Ear: External ear normal.     Left Ear: External ear normal.     Nose: Nose normal. No congestion.     Mouth/Throat:     Mouth: Mucous membranes are moist.     Pharynx: Oropharynx is clear.     Comments: No signs of any irritated gum lesions or inflammation by exam.  Just tenderness in the left cheek area and soreness. Eyes:     General: No scleral icterus.  Right eye: No discharge.        Left eye: No discharge.     Conjunctiva/sclera: Conjunctivae normal.     Pupils: Pupils are equal, round, and reactive to light.  Neck:     Musculoskeletal: Normal range of motion.     Thyroid: No thyromegaly.     Vascular: No JVD.  Pulmonary:     Breath sounds: Normal breath sounds.  Abdominal:     General: Bowel sounds are normal.     Palpations: Abdomen is soft. There is no mass.     Tenderness: There is no abdominal tenderness. There is no guarding or rebound.  Musculoskeletal: Normal range of motion.  Skin:    General: Skin is warm and dry.     Findings: Lesion  present.     Comments: 3 irritated skin tag-like lesions excised.  One on the right lateral breast area near the axilla.  One on the right anterior abdomen.  One on the left medial thigh near the groin area.  Also, an irritated seborrheic keratosis had cryotherapy performed.  She tolerated all these procedures well and dressings were applied after excision of lesions which were sent off for pathology.  Neurological:     Mental Status: She is alert and oriented to person, place, and time.     Deep Tendon Reflexes: Reflexes are normal and symmetric.  Psychiatric:        Mood and Affect: Mood normal.        Behavior: Behavior normal.        Thought Content: Thought content normal.        Judgment: Judgment normal.     Comments: Mood affect and behavior were normal for this patient.    BP (!) 154/76 (BP Location: Left Arm)   Pulse 60   Temp 97.6 F (36.4 C) (Oral)   Ht 4\' 10"  (1.473 m)   Wt 149 lb (67.6 kg)   BMI 31.14 kg/m        Assessment & Plan:  1. Pain, dental -Z-Pak -Make appointment with dentist  2. Generalized skin lesions -Lesions excised which appear to be skin tags that were irritated and one lesion had cryotherapy which was like an irritated seborrheic keratosis.  Meds ordered this encounter  Medications  . azithromycin (ZITHROMAX) 250 MG tablet    Sig: As directed    Dispense:  6 tablet    Refill:  0   Patient Instructions  Keep the skin excision lesion sites cleaned with alcohol If any redness or soreness develops get back in touch with [place] Keep the area of cryotherapy cleanse and be aware that it may form a blister before it peels off. Take antibiotic as directed and call and make appointment with dentist for follow-up  Arrie Senate MD

## 2018-11-07 NOTE — Patient Instructions (Signed)
Keep the skin excision lesion sites cleaned with alcohol If any redness or soreness develops get back in touch with [place] Keep the area of cryotherapy cleanse and be aware that it may form a blister before it peels off. Take antibiotic as directed and call and make appointment with dentist for follow-up

## 2018-11-09 LAB — PATHOLOGY

## 2019-01-08 ENCOUNTER — Telehealth: Payer: Self-pay | Admitting: Family Medicine

## 2019-01-22 ENCOUNTER — Ambulatory Visit: Payer: Medicare Other | Admitting: *Deleted

## 2019-01-23 ENCOUNTER — Encounter: Payer: Medicare Other | Admitting: *Deleted

## 2019-01-26 ENCOUNTER — Ambulatory Visit: Payer: Medicare Other | Admitting: Family Medicine

## 2019-02-21 ENCOUNTER — Encounter: Payer: Self-pay | Admitting: *Deleted

## 2019-02-21 ENCOUNTER — Other Ambulatory Visit: Payer: Self-pay

## 2019-02-21 ENCOUNTER — Ambulatory Visit (INDEPENDENT_AMBULATORY_CARE_PROVIDER_SITE_OTHER): Payer: Medicare Other | Admitting: *Deleted

## 2019-02-21 DIAGNOSIS — Z0000 Encounter for general adult medical examination without abnormal findings: Secondary | ICD-10-CM

## 2019-02-21 NOTE — Progress Notes (Signed)
MEDICARE ANNUAL WELLNESS VISIT  02/21/2019  Telephone Visit Disclaimer This Medicare AWV was conducted by telephone due to national recommendations for restrictions regarding the COVID-19 Pandemic (e.g. social distancing).  I verified, using two identifiers, that I am speaking with Maria Camacho or their authorized healthcare agent. I discussed the limitations, risks, security, and privacy concerns of performing an evaluation and management service by telephone and the potential availability of an in-person appointment in the future. The patient expressed understanding and agreed to proceed.   Subjective:  Maria Camacho is a 85 y.o. female patient of Chipper Herb, MD who had a Medicare Annual Wellness Visit today via telephone. 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  She lives with her husband who she is a caregiver for.  They have 3 children, 4 grandchildren, and 1 great grandchild. she reports that she is socially active and does interact with friends/family regularly.  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   She is moderately physically active doing tai chi at home currently, and goes to the recreation department for exercise classes when it is open.  She enjoys crocheting, reading, and playing computer games.   Patient Care Team: Chipper Herb, MD as PCP - General (Family Medicine) Kerry Kass, MD as Referring Physician (Surgery) Christy Sartorius, MD as Referring Physician (Urology) Marygrace Drought, MD as Consulting Physician (Ophthalmology) Steffanie Rainwater, DPM as Consulting Physician (Podiatry)  Advanced Directives 02/21/2019 12/29/2015 11/25/2014 08/10/2012  Does Patient Have a Medical Advance Directive? Yes Yes Yes Patient has advance directive, copy not in chart  Type of Advance Directive Healthcare Power of Attorney Living will;Healthcare Power of [place];Living will [place];Living will  Does patient want to make changes to medical advance  directive? - No - Patient declined - -  Copy of [place] in Chart? No - copy requested No - copy requested No - copy requested Copy requested from family  Pre-existing out of facility DNR order (yellow form or pink MOST form) - - - No    Hospital Utilization Over the Past 12 Months: # of hospitalizations or ER visits: 0 # of surgeries: 0  Review of Systems    Patient reports that her overall health is unchanged compared to last year.  Patient Reported Readings (BP, Pulse, CBG, Weight, etc) Weight 150 lb  BP 126/77 pulse 75 on 02/20/2019  Review of Systems: Musculoskeletal ROS: positive for - joint pain left knee and pain in finger  All other systems negative.  Pain Assessment Pain Score: 2      Current Medications & Allergies (verified) Allergies as of 02/21/2019      Reactions   Actonel [risedronate Sodium] Other (See Comments)   dizziness   Bacitracin Rash   Livalo [pitavastatin] Nausea Only   Oxytrol [oxybutynin] Rash   Penicillins    REACTION: hives      Medication List       Accurate as of February 21, 2019  9:29 AM. Always use your most recent med list.        acetaminophen 500 MG tablet Commonly known as:  TYLENOL Take 500 mg by mouth as needed. Reported on 12/29/2015   Allergy 4 MG tablet Generic drug:  chlorpheniramine Take 4 mg by mouth daily as needed for allergies.   amLODipine-valsartan 10-320 MG tablet Commonly known as:  EXFORGE Take 0.5 tablets by mouth daily.   BEC/Zinc Tabs Take 1 tablet by mouth daily.  bimatoprost 0.01 % Soln Commonly known as:  LUMIGAN Place 1 drop into both eyes at bedtime.   conjugated estrogens vaginal cream Commonly known as:  Premarin Place 0.5 Applicatorfuls vaginally daily as needed. Reported on 03/01/2016   dorzolamide-timolol 22.3-6.8 MG/ML ophthalmic solution Commonly known as:  COSOPT Place 1 drop into both eyes daily.   fish oil-omega-3 fatty acids 1000 MG capsule Take 1 g by mouth  daily.   Vitamin D 50 MCG (2000 UT) tablet Take 2,000 Units by mouth daily. Take 2000IU daily M-F and 4000IU Sat and Sun       History (reviewed): Past Medical History:  Diagnosis Date  . Allergy    seasonal   . Cancer ([place])    kidney right  . Cataract    see opth note from 09/2013  . Glaucoma 09/2013   open angle, low risk  . Hyperlipidemia   . Hypertension   . Osteopenia    dexa 09/2013  . Vertigo 2011   Past Surgical History:  Procedure Laterality Date  . ABDOMINAL HYSTERECTOMY  1979  . ACNE CYST REMOVAL  over 20 yrs. ago   Fatty tiisue of neck  . KNEE ARTHROSCOPY  08/10/2012   Procedure: ARTHROSCOPY KNEE;  Surgeon: Johnn Hai, MD;  Location: [facility];  Service: Orthopedics;  Laterality: Left;  WITH DEBRIDEMENT  . NM PET DX LYMPHOMA    . right kidney cancer surgery  2006   Family History  Problem Relation Age of Onset  . Diabetes Mother   . Heart disease Mother        No details  . COPD Mother   . Macular degeneration Mother   . Stroke Mother   . Cancer Father        stomach  . Cancer Sister 33       ?uterine  . Obesity Sister   . Cancer Brother   . Cancer Brother   . Heart disease Brother   . Cancer Brother        lung  . Cancer Maternal Grandmother        unknown   Social History   Socioeconomic History  . Marital status: Married    Spouse name: Not on file  . Number of children: 3  . Years of education: Not on file  . Highest education level: Not on file  Occupational History  . Occupation: Retired    Comment: Tour manager  . Financial resource strain: Not hard at all  . Food insecurity:    Worry: Never true    Inability: Never true  . Transportation needs:    Medical: No    Non-medical: No  Tobacco Use  . Smoking status: Never Smoker  . Smokeless tobacco: Never Used  Substance and Sexual Activity  . Alcohol use: No  . Drug use: No  . Sexual activity: Not on file  Lifestyle  . Physical activity:    Days per week: 3  days    Minutes per session: 30 min  . Stress: Only a little  Relationships  . Social connections:    Talks on phone: More than three times a week    Gets together: More than three times a week    Attends religious service: More than 4 times per year    Active member of club or organization: Yes    Attends meetings of clubs or organizations: More than 4 times per year    Relationship status: Married  Other Topics Concern  .  Not on file  Social History Narrative   Lives at home with husband - married almost 101 years  (2019)    Activities of Daily Living In your present state of health, do you have any difficulty performing the following activities: 02/21/2019 02/21/2019  Hearing? - N  Vision? - N  Difficulty concentrating or making decisions? - N  Walking or climbing stairs? - N  Dressing or bathing? - N  Doing errands, shopping? - N  Conservation officer, nature and eating ? - N  Using the Toilet? - N  In the past six months, have you accidently leaked urine? (No Data) Y  Comment Wears incontinence pad as needed, goes to a urologist -  Do you have problems with loss of bowel control? - N  Managing your Medications? - N  Managing your Finances? - N  Housekeeping or managing your Housekeeping? - N  Comment - Has a house keeper that helps  Some recent data might be hidden    Patient Literacy    Exercise Current Exercise Habits: Home exercise routine, Type of exercise: stretching, Time (Minutes): 30, Frequency (Times/Week): 3, Weekly Exercise (Minutes/Week): 90, Intensity: Moderate  Diet Patient reports consuming 3 meals a day and 1 snack(s) a day Patient reports that her primary diet is: Regular Patient reports that she does have regular access to food.  Patient describes a healthy diet of mostly vegetables, fruits, lean proteins, and whole grains   Depression Screen PHQ 2/9 Scores 02/21/2019 11/07/2018 09/26/2018 04/26/2018 01/19/2018 12/06/2017 11/03/2017  PHQ - 2 Score 0 0 0 0 0 0 0      Fall Risk Fall Risk  02/21/2019 11/07/2018 09/26/2018 04/26/2018 01/19/2018  Falls in the past year? 0 0 0 No No  Number falls in past yr: - - - - -  Injury with Fall? - - - - -  Comment - - - - -     Objective:  Maria Camacho seemed alert and oriented and she participated appropriately during our telephone visit.  Blood Pressure Weight BMI  BP Readings from Last 3 Encounters:  11/07/18 (!) 154/76  09/26/18 133/74  07/12/18 137/76   Wt Readings from Last 3 Encounters:  11/07/18 149 lb (67.6 kg)  09/26/18 149 lb (67.6 kg)  07/12/18 149 lb (67.6 kg)   BMI Readings from Last 1 Encounters:  11/07/18 31.14 kg/m    *Unable to obtain current vital signs, weight, and BMI due to telephone visit type  Hearing/Vision  . Kingsley did not seem to have difficulty with hearing/understanding during the telephone conversation . Reports that she has had a formal eye exam by an eye care professional within the past year . Reports that she has not had a formal hearing evaluation within the past year *Unable to fully assess hearing and vision during telephone visit type  Cognitive Function: 6CIT Screen 02/21/2019  What Year? 0 points  What month? 0 points  What time? 0 points  Count back from 20 0 points  Months in reverse 0 points  Repeat phrase 0 points  Total Score 0    Normal Cognitive Function Screening: Yes (Normal:0-7, Significant for Dysfunction: >8)  Immunization & Health Maintenance Record Immunization History  Administered Date(s) Administered  . Influenza, High Dose Seasonal PF 07/29/2016, 08/09/2017, 08/02/2018  . Influenza,inj,Quad PF,6+ Mos 07/26/2013, 07/30/2014, 07/30/2015  . Pneumococcal Conjugate-13 12/26/2013  . Pneumococcal Polysaccharide-23 07/26/2007  . Tdap 02/23/2011    Health Maintenance  Topic Date Due  . INFLUENZA VACCINE  05/26/2019  .  MAMMOGRAM  11/23/2019  . DEXA SCAN  12/07/2019  . TETANUS/TDAP  02/22/2021  . PNA vac Low Risk Adult  Completed        Assessment  This is a routine wellness examination for Maria Camacho.  Health Maintenance: Due or Overdue There are no preventive care reminders to display for this patient.  Maria Camacho does not need a referral for Community Assistance: Care Management:   no Social Work:    no Prescription Assistance:  no Nutrition/Diabetes Education:  no   Plan:  Personalized Goals Goals Addressed            This Visit's Progress   . Prevent falls       Stay active and exercise  Continue to move carefully and use handrails when going up and down stairs      Personalized Health Maintenance & Screening Recommendations  Advanced directives: has an advanced directive - a copy HAS NOT been provided.  Recommend Shingrix vaccine series in the future  Lung Cancer Screening Recommended: not applicable (Low Dose CT Chest recommended if Age 64-80 years, 30 pack-year currently smoking OR have quit w/in past 15 years) Hepatitis C Screening recommended: not applicable   Advanced Directives: Written information was not prepared per patient's request.    Follow-up Plan . Follow-up with Chipper Herb, MD as planned . Continue working to prevent falls    I have personally reviewed and noted the following in the patient's chart:   . Medical and social history . Use of alcohol, tobacco or illicit drugs  . Current medications and supplements . Functional ability and status . Nutritional status . Physical activity . Advanced directives . List of other physicians . Hospitalizations, surgeries, and ER visits in previous 12 months . Vitals . Screenings to include cognitive, depression, and falls . Referrals and appointments  In addition, I have reviewed and discussed with Maria Camacho certain preventive protocols, quality metrics, and best practice recommendations. A written personalized care plan for preventive services as well as general preventive health recommendations is available and  can be mailed to the patient at her request.      Nolberto Hanlon, RN  02/21/2019

## 2019-02-21 NOTE — Patient Instructions (Signed)
  Maria Camacho , Thank you for taking time to talk with me for your Medicare Wellness Visit. I appreciate your ongoing commitment to your health goals. Please review the following plan we discussed and let me know if I can assist you in the future.   These are the goals we discussed: Goals    . Prevent falls     Stay active and exercise  Continue to move carefully and use handrails when going up and down stairs       This is a list of the screening recommended for you and due dates:  Health Maintenance  Topic Date Due  . Flu Shot  05/26/2019  . Mammogram  11/23/2019  . DEXA scan (bone density measurement)  12/07/2019  . Tetanus Vaccine  02/22/2021  . Pneumonia vaccines  Completed

## 2019-02-22 ENCOUNTER — Encounter: Payer: Self-pay | Admitting: Family Medicine

## 2019-02-22 ENCOUNTER — Other Ambulatory Visit: Payer: Self-pay

## 2019-02-22 ENCOUNTER — Ambulatory Visit (INDEPENDENT_AMBULATORY_CARE_PROVIDER_SITE_OTHER): Payer: Medicare Other | Admitting: Family Medicine

## 2019-02-22 DIAGNOSIS — E559 Vitamin D deficiency, unspecified: Secondary | ICD-10-CM

## 2019-02-22 DIAGNOSIS — E782 Mixed hyperlipidemia: Secondary | ICD-10-CM

## 2019-02-22 DIAGNOSIS — Z85528 Personal history of other malignant neoplasm of kidney: Secondary | ICD-10-CM

## 2019-02-22 DIAGNOSIS — K219 Gastro-esophageal reflux disease without esophagitis: Secondary | ICD-10-CM | POA: Diagnosis not present

## 2019-02-22 DIAGNOSIS — I10 Essential (primary) hypertension: Secondary | ICD-10-CM

## 2019-02-22 DIAGNOSIS — E785 Hyperlipidemia, unspecified: Secondary | ICD-10-CM

## 2019-02-22 DIAGNOSIS — E7800 Pure hypercholesterolemia, unspecified: Secondary | ICD-10-CM | POA: Insufficient documentation

## 2019-02-22 DIAGNOSIS — C649 Malignant neoplasm of unspecified kidney, except renal pelvis: Secondary | ICD-10-CM

## 2019-02-22 DIAGNOSIS — N8110 Cystocele, unspecified: Secondary | ICD-10-CM

## 2019-02-22 NOTE — Addendum Note (Signed)
Addended by: Zannie Cove on: 02/22/2019 03:48 PM   Modules accepted: Orders

## 2019-02-22 NOTE — Patient Instructions (Signed)
The patient should continue with her current treatment regimen.   She should practice good pulmonary and hand hygiene She should drink plenty of fluids and stay well-hydrated She should follow-up with GYN because of bladder prolapse. She should continue to be careful and not put herself at risk for falling She should continue to watch her sodium intake and check her blood pressures periodically along with her weight.

## 2019-02-22 NOTE — Progress Notes (Signed)
Virtual Visit Via telephone Note I connected with@ on 02/22/19 by telephone and verified that I am speaking with the correct person or authorized healthcare agent using two identifiers. Maria Camacho is currently located at home and there are no unauthorized people in close proximity. I completed this visit while in a private location in my home .  I connected to the patient by telephone and verified that I was speaking with the correct person.  This visit type was conducted due to national recommendations for restrictions regarding the COVID-19 Pandemic (e.g. social distancing).  This format is felt to be most appropriate for this patient at this time.  All issues noted in this document were discussed and addressed.  No physical exam was performed.    I discussed the limitations, risks, security and privacy concerns of performing an evaluation and management service by telephone and the availability of in person appointments. I also discussed with the patient that there may be a patient responsible charge related to this service. The patient expressed understanding and agreed to proceed.   Date:  02/22/2019    ID:  Maria Camacho      1933/09/07        [id number]   Patient Care Team Patient Care Team: Chipper Herb, MD as PCP - General (Family Medicine) Kerry Kass, MD as Referring Physician (Surgery) Christy Sartorius, MD as Referring Physician (Urology) Marygrace Drought, MD as Consulting Physician (Ophthalmology) Steffanie Rainwater, DPM as Consulting Physician (Podiatry)  Reason for Visit: Primary Care Follow-up     History of Present Illness & Review of Systems:     Maria Camacho is a 85 y.o. year old female primary care patient that presents today for a telehealth visit.  The patient is pleasant and doing well.  She is staying in and avoiding being out in the public as much as possible.  Her husband is more confined because of more chronic medical conditions.  She does have sons  that can help.  Today she denies any chest pain pressure tightness or shortness of breath.  She denies any trouble with swallowing heartburn indigestion nausea vomiting diarrhea blood in the stool or black tarry bowel movements.  She has been followed regularly by the urologist, Dr. Junious Silk and currently is deferring any surgery for bladder prolapse unless the problem gets worse.  She does not have a return appointment to see him.  She did have an eye exam in January and the cataract is getting larger.  She is going to defer any surgery until she has more trouble.  She is also had some toenail work by the podiatrist and that is doing okay.  She had tooth that had to be pulled in her mouth and she is currently not doing anything about that as far as having a bridge placed at this point in time.  She is taking some over-the-counter allergy medicine.  Review of systems as stated otherwise negative for body systems left unmentioned.   The patient does not have symptoms concerning for COVID-19 infection (fever, chills, cough, or new shortness of breath).      Current Medications (Verified) Allergies as of 02/22/2019      Reactions   Actonel [risedronate Sodium] Other (See Comments)   dizziness   Bacitracin Rash   Livalo [pitavastatin] Nausea Only   Oxytrol [oxybutynin] Rash   Penicillins    REACTION: hives      Medication List  Accurate as of February 22, 2019  1:35 PM. Always use your most recent med list.        acetaminophen 500 MG tablet Commonly known as:  TYLENOL Take 500 mg by mouth as needed. Reported on 12/29/2015   Allergy 4 MG tablet Generic drug:  chlorpheniramine Take 4 mg by mouth daily as needed for allergies.   amLODipine-valsartan 10-320 MG tablet Commonly known as:  EXFORGE Take 0.5 tablets by mouth daily.   BEC/Zinc Tabs Take 1 tablet by mouth daily.   bimatoprost 0.01 % Soln Commonly known as:  LUMIGAN Place 1 drop into both eyes at bedtime.   conjugated  estrogens vaginal cream Commonly known as:  Premarin Place 0.5 Applicatorfuls vaginally daily as needed. Reported on 03/01/2016   dorzolamide-timolol 22.3-6.8 MG/ML ophthalmic solution Commonly known as:  COSOPT Place 1 drop into both eyes daily.   fish oil-omega-3 fatty acids 1000 MG capsule Take 1 g by mouth daily.   Vitamin D 50 MCG (2000 UT) tablet Take 2,000 Units by mouth daily. Take 2000IU daily M-F and 4000IU Sat and Sun           Allergies (Verified)    Actonel [risedronate sodium]; Bacitracin; Livalo [pitavastatin]; Oxytrol [oxybutynin]; and Penicillins  Past Medical History Past Medical History:  Diagnosis Date  . Allergy    seasonal   . Cancer ([place])    kidney right  . Cataract    see opth note from 09/2013  . Glaucoma 09/2013   open angle, low risk  . Hyperlipidemia   . Hypertension   . Osteopenia    dexa 09/2013  . Vertigo 2011     Past Surgical History:  Procedure Laterality Date  . ABDOMINAL HYSTERECTOMY  1979  . ACNE CYST REMOVAL  over 20 yrs. ago   Fatty tiisue of neck  . KNEE ARTHROSCOPY  08/10/2012   Procedure: ARTHROSCOPY KNEE;  Surgeon: Johnn Hai, MD;  Location: [facility];  Service: Orthopedics;  Laterality: Left;  WITH DEBRIDEMENT  . NM PET DX LYMPHOMA    . right kidney cancer surgery  2006    Social History   Socioeconomic History  . Marital status: Married    Spouse name: Not on file  . Number of children: 3  . Years of education: Not on file  . Highest education level: Not on file  Occupational History  . Occupation: Retired    Comment: Tour manager  . Financial resource strain: Not hard at all  . Food insecurity:    Worry: Never true    Inability: Never true  . Transportation needs:    Medical: No    Non-medical: No  Tobacco Use  . Smoking status: Never Smoker  . Smokeless tobacco: Never Used  Substance and Sexual Activity  . Alcohol use: No  . Drug use: No  . Sexual activity: Not on file  Lifestyle  .  Physical activity:    Days per week: 3 days    Minutes per session: 30 min  . Stress: Only a little  Relationships  . Social connections:    Talks on phone: More than three times a week    Gets together: More than three times a week    Attends religious service: More than 4 times per year    Active member of club or organization: Yes    Attends meetings of clubs or organizations: More than 4 times per year    Relationship status: Married  Other Topics Concern  .  Not on file  Social History Narrative   Lives at home with husband - married almost 77 years  (2019)     Family History  Problem Relation Age of Onset  . Diabetes Mother   . Heart disease Mother        No details  . COPD Mother   . Macular degeneration Mother   . Stroke Mother   . Cancer Father        stomach  . Cancer Sister 39       ?uterine  . Obesity Sister   . Cancer Brother   . Cancer Brother   . Heart disease Brother   . Cancer Brother        lung  . Cancer Maternal Grandmother        unknown      Labs/Other Tests and Data Reviewed:    Wt Readings from Last 3 Encounters:  11/07/18 149 lb (67.6 kg)  09/26/18 149 lb (67.6 kg)  07/12/18 149 lb (67.6 kg)   Temp Readings from Last 3 Encounters:  11/07/18 97.6 F (36.4 C) (Oral)  09/26/18 98.4 F (36.9 C) (Oral)  04/26/18 98.2 F (36.8 C) (Oral)   BP Readings from Last 3 Encounters:  11/07/18 (!) 154/76  09/26/18 133/74  07/12/18 137/76   Pulse Readings from Last 3 Encounters:  11/07/18 60  09/26/18 73  07/12/18 68     No results found for: HGBA1C Lab Results  Component Value Date   LDLCALC 109 (H) 09/22/2018   CREATININE 0.94 09/22/2018       Chemistry      Component Value Date/Time   NA 141 09/22/2018 1501   K 4.6 09/22/2018 1501   CL 104 09/22/2018 1501   CO2 22 09/22/2018 1501   BUN 15 09/22/2018 1501   CREATININE 0.94 09/22/2018 1501   CREATININE 0.89 03/02/2013 0858      Component Value Date/Time   CALCIUM 9.5  09/22/2018 1501   ALKPHOS 64 09/22/2018 1501   AST 23 09/22/2018 1501   ALT 17 09/22/2018 1501   BILITOT <0.2 09/22/2018 1501         OBSERVATIONS/ OBJECTIVE:     The patient is alert and cooperative and related her drugs and medical history to me without problems.  She says that her weight is about 150.  Her blood pressure has been running in the 1 20-1 40 range over the 70s.  Her pulse is 68.  She is pleasant and has no specific concerns and does not need any refills.  Physical exam deferred due to nature of telephonic visit.  ASSESSMENT & PLAN    Time:   Today, I have spent 25 minutes with the patient via telephone discussing the above including Covid precautions.     Visit Diagnoses: 1. Mixed hyperlipidemia -Patient has resisted taking medicines for her cholesterol in the past and will continue with aggressive therapeutic lifestyle changes.  2. Essential hypertension -Her blood pressure is been running good at home and she will continue with her current treatment regimen  3. Gastroesophageal reflux disease, esophagitis presence not specified -No complaints today with reflux  4. Vitamin D deficiency -Continue vitamin D replacement pending results of future lab work  5. Renal carcinoma, unspecified laterality [facility]) -Patient has been seeing Dr. Jonette Eva about this  6. History of renal carcinoma -We will continue to follow-up with Dr. Jonette Eva as needed  7. Bladder prolapse, female, acquired -Patient does have bladder prolapse and is currently  seeing Dr. Junious Silk and after the last visit she will call him if she continues to have trouble with this.  8. Dyslipidemia -Continue aggressive therapeutic lifestyle changes including diet and exercise  Patient Instructions  The patient should continue with her current treatment regimen.   She should practice good pulmonary and hand hygiene She should drink plenty of fluids and stay well-hydrated She should follow-up with  GYN because of bladder prolapse. She should continue to be careful and not put herself at risk for falling She should continue to watch her sodium intake and check her blood pressures periodically along with her weight.     The above assessment and management plan was discussed with the patient. The patient verbalized understanding of and has agreed to the management plan. Patient is aware to call the clinic if symptoms persist or worsen. Patient is aware when to return to the clinic for a follow-up visit. Patient educated on when it is appropriate to go to the emergency department.    Chipper Herb, MD [address], [place], [place] [address] Ph [phone]   Arrie Senate MD

## 2019-03-12 ENCOUNTER — Encounter: Payer: Self-pay | Admitting: Gastroenterology

## 2019-04-20 ENCOUNTER — Other Ambulatory Visit: Payer: Self-pay

## 2019-04-23 ENCOUNTER — Encounter: Payer: Self-pay | Admitting: Family Medicine

## 2019-04-23 ENCOUNTER — Ambulatory Visit (INDEPENDENT_AMBULATORY_CARE_PROVIDER_SITE_OTHER): Payer: Medicare Other | Admitting: Family Medicine

## 2019-04-23 ENCOUNTER — Other Ambulatory Visit: Payer: Self-pay

## 2019-04-23 VITALS — BP 158/74 | HR 65 | Temp 97.8°F | Ht <= 58 in | Wt 150.0 lb

## 2019-04-23 DIAGNOSIS — W57XXXA Bitten or stung by nonvenomous insect and other nonvenomous arthropods, initial encounter: Secondary | ICD-10-CM

## 2019-04-23 DIAGNOSIS — M25562 Pain in left knee: Secondary | ICD-10-CM

## 2019-04-23 DIAGNOSIS — G8929 Other chronic pain: Secondary | ICD-10-CM

## 2019-04-23 MED ORDER — LIDOCAINE HCL 2 % IJ SOLN: 3.0000 mL | Freq: Once | INTRAMUSCULAR | Status: DC

## 2019-04-23 MED ORDER — METHYLPREDNISOLONE ACETATE 80 MG/ML IJ SUSP
60.0000 mg | Freq: Once | INTRAMUSCULAR | Status: AC
  Administered 2019-04-23: 60 mg via INTRA_ARTICULAR

## 2019-04-23 NOTE — Progress Notes (Signed)
Subjective:  Patient ID: Maria Camacho, female    DOB: 1932/12/26, 85 y.o.   MRN: [id number]  Chief Complaint:  Knee Pain and Tick Removal (bite 4 weeks ago )   HPI: Maria Camacho is a 85 y.o. female presenting on 04/23/2019 for Knee Pain and Tick Removal (bite 4 weeks ago )   1. Tick bite, initial encounter  Pt reports she removed a tick from her right lower back 4 weeks ago. States she has a red area where the tick was embedded. She states the site it pruritic. She denies fever, chills, malaise, fatigue, headaches, abdominal pain, nausea, vomiting, or diarrhea. No rash.  She has been using hydrocortisone cream with great relief of pruritis.    2. Chronic pain of left knee  Pt has a known history of DJD of left knee. Pt had knee arthroscopy in 2013 for DJD and torn meniscus. Pt states she has done pretty well with this. States she has intermittent bilateral knee pain. States she is now having ongoing left knee pain and intermittent hip pain and right knee pain. Pt states the pain is worse with weight bearing and movements. States she has a hard time walking due to the pain. States she has tried tylenol without relief of the pain. States she has been mixing honey and cinnamon and this seems to be helping. States the pain is aching, 6/10 at worst. No injury or loss of function.      Relevant past medical, surgical, family, and social history reviewed and updated as indicated.  Allergies and medications reviewed and updated.   Past Medical History:  Diagnosis Date  . Allergy    seasonal   . Cancer ([place])    kidney right  . Cataract    see opth note from 09/2013  . Glaucoma 09/2013   open angle, low risk  . Hyperlipidemia   . Hypertension   . Osteopenia    dexa 09/2013  . Vertigo 2011    Past Surgical History:  Procedure Laterality Date  . ABDOMINAL HYSTERECTOMY  1979  . ACNE CYST REMOVAL  over 20 yrs. ago   Fatty tiisue of neck  . KNEE ARTHROSCOPY  08/10/2012   Procedure: ARTHROSCOPY KNEE;  Surgeon: Johnn Hai, MD;  Location: [facility];  Service: Orthopedics;  Laterality: Left;  WITH DEBRIDEMENT  . NM PET DX LYMPHOMA    . right kidney cancer surgery  2006    Social History   Socioeconomic History  . Marital status: Married    Spouse name: Not on file  . Number of children: 3  . Years of education: Not on file  . Highest education level: Not on file  Occupational History  . Occupation: Retired    Comment: Tour manager  . Financial resource strain: Not hard at all  . Food insecurity    Worry: Never true    Inability: Never true  . Transportation needs    Medical: No    Non-medical: No  Tobacco Use  . Smoking status: Never Smoker  . Smokeless tobacco: Never Used  Substance and Sexual Activity  . Alcohol use: No  . Drug use: No  . Sexual activity: Not on file  Lifestyle  . Physical activity    Days per week: 3 days    Minutes per session: 30 min  . Stress: Only a little  Relationships  . Social connections    Talks on phone: More than three times a  week    Gets together: More than three times a week    Attends religious service: More than 4 times per year    Active member of club or organization: Yes    Attends meetings of clubs or organizations: More than 4 times per year    Relationship status: Married  . Intimate partner violence    Fear of current or ex partner: No    Emotionally abused: No    Physically abused: No    Forced sexual activity: No  Other Topics Concern  . Not on file  Social History Narrative   Lives at home with husband - married almost 50 years  (2019)    Outpatient Encounter Medications as of 04/23/2019  Medication Sig  . acetaminophen (TYLENOL) 500 MG tablet Take 500 mg by mouth as needed. Reported on 12/29/2015  . amLODipine-valsartan (EXFORGE) 10-320 MG tablet Take 0.5 tablets by mouth daily.  . B Complex-C-E-Zn (BEC/ZINC) TABS Take 1 tablet by mouth daily.   . bimatoprost (LUMIGAN)  0.01 % SOLN Place 1 drop into both eyes at bedtime.   . chlorpheniramine (ALLERGY) 4 MG tablet Take 4 mg by mouth daily as needed for allergies.  . Cholecalciferol (VITAMIN D) 2000 UNITS tablet Take 2,000 Units by mouth daily. Take 2000IU daily M-F and 4000IU Sat and Sun  . conjugated estrogens (PREMARIN) vaginal cream Place 0.5 Applicatorfuls vaginally daily as needed. Reported on 03/01/2016  . dorzolamide-timolol (COSOPT) 22.3-6.8 MG/ML ophthalmic solution Place 1 drop into both eyes daily.   . fish oil-omega-3 fatty acids 1000 MG capsule Take 1 g by mouth daily.  . NON FORMULARY Probiotic with cranberry   Facility-Administered Encounter Medications as of 04/23/2019  Medication  . lidocaine (XYLOCAINE) 2 % (with pres) injection 60 mg  . [COMPLETED] methylPREDNISolone acetate (DEPO-MEDROL) injection 60 mg    Allergies  Allergen Reactions  . Actonel [Risedronate Sodium] Other (See Comments)    dizziness  . Bacitracin Rash  . Livalo [Pitavastatin] Nausea Only  . Oxytrol [Oxybutynin] Rash  . Penicillins     REACTION: hives    Review of Systems  Constitutional: Negative for activity change, chills, fatigue and fever.  Eyes: Negative for photophobia and visual disturbance.  Respiratory: Negative for cough, chest tightness, shortness of breath and wheezing.   Cardiovascular: Negative for chest pain, palpitations and leg swelling.  Gastrointestinal: Negative for abdominal pain.  Genitourinary: Negative for difficulty urinating.  Musculoskeletal: Positive for arthralgias (left knee), gait problem and joint swelling.  Skin: Positive for wound (tick bite). Negative for color change.  Neurological: Negative for dizziness, tremors, seizures, syncope, facial asymmetry, speech difficulty, weakness, light-headedness, numbness and headaches.  Psychiatric/Behavioral: Negative for confusion.  All other systems reviewed and are negative.       Objective:  BP (!) 158/74   Pulse 65   Temp 97.8  F (36.6 C) (Oral)   Ht 4\' 10"  (1.473 m)   Wt 150 lb (68 kg)   BMI 31.35 kg/m    Wt Readings from Last 3 Encounters:  04/23/19 150 lb (68 kg)  11/07/18 149 lb (67.6 kg)  09/26/18 149 lb (67.6 kg)     Physical Exam Vitals signs and nursing note reviewed.  Constitutional:      General: She is not in acute distress.    Appearance: Normal appearance. She is obese. She is not ill-appearing or toxic-appearing.  HENT:     Head: Normocephalic and atraumatic.     Mouth/Throat:     Mouth: Mucous  membranes are moist.  Eyes:     Conjunctiva/sclera: Conjunctivae normal.     Pupils: Pupils are equal, round, and reactive to light.  Neck:     Musculoskeletal: Normal range of motion and neck supple. No muscular tenderness.     Vascular: No carotid bruit.  Cardiovascular:     Rate and Rhythm: Normal rate and regular rhythm.     Pulses: Normal pulses.     Heart sounds: Normal heart sounds. No murmur. No friction rub. No gallop.   Pulmonary:     Effort: Pulmonary effort is normal. No respiratory distress.     Breath sounds: Normal breath sounds.  Musculoskeletal:     Right hip: Normal.     Left hip: Normal.     Right knee: Normal.     Left knee: She exhibits decreased range of motion and swelling. She exhibits no effusion, no ecchymosis, no deformity, no laceration, no erythema, normal alignment, no LCL laxity, normal patellar mobility, no bony tenderness, normal meniscus and no MCL laxity. Tenderness found.     Lumbar back: Normal.  Lymphadenopathy:     Cervical: No cervical adenopathy.  Skin:    General: Skin is warm and dry.     Capillary Refill: Capillary refill takes less than 2 seconds.     Findings: Lesion present.       Neurological:     General: No focal deficit present.     Mental Status: She is alert and oriented to person, place, and time.  Psychiatric:        Mood and Affect: Mood normal.        Behavior: Behavior normal. Behavior is cooperative.        Thought  Content: Thought content normal.        Judgment: Judgment normal.     Results for orders placed or performed in visit on 11/07/18  Pathology  Result Value Ref Range   . Comment    . Comment    . Comment    . Comment    . Comment    . Comment    . Comment    . Comment      Joint Injection/Arthrocentesis  Date/Time: 04/23/2019 9:40 AM Performed by: Baruch Gouty, FNP Authorized by: Baruch Gouty, FNP  Indications: joint swelling and pain  Body area: knee Joint: left knee Local anesthesia used: yes  Anesthesia: Local anesthesia used: yes Local Anesthetic: lidocaine spray  Sedation: Patient sedated: no  Preparation: Patient was prepped and draped in the usual sterile fashion (Pt gave verbal consent after procedure and risks and benefits were discussed.  Pts name, DOB, allergies, and procedure verified. ). Needle size: 20 G Ultrasound guidance: no Approach: anterior Aspirate amount: 0 mL Methylprednisolone amount: 60 mg Lidocaine 2% amount: 3 mL Patient tolerance: patient tolerated the procedure well with no immediate complications   .pro  Pertinent labs & imaging results that were available during my care of the patient were reviewed by me and considered in my medical decision making.  Assessment & Plan:  Reannon was seen today for knee pain and tick removal.  Diagnoses and all orders for this visit:  Tick bite, initial encounter Not concerning for tick born illness. Local reaction. Continue hydrocortisone cream as needed. Report any new or worsening symptoms.   Chronic pain of left knee Chronic left knee pain due to DJD. Joint injection today. Will refer to PT if symptoms do not improve. Follow up in 4 weeks for reevaluation.  Symptomatic care at home discussed.  -     methylPREDNISolone acetate (DEPO-MEDROL) injection 60 mg -     lidocaine (XYLOCAINE) 2 % (with pres) injection 3 ml -     Joint Injection/Arthrocentesis     Continue all other maintenance  medications.  Follow up plan: Return in about 4 weeks (around 05/21/2019), or if symptoms worsen or fail to improve, for Knee pain.  Educational handout given for knee injection, survey  The above assessment and management plan was discussed with the patient. The patient verbalized understanding of and has agreed to the management plan. Patient is aware to call the clinic if symptoms persist or worsen. Patient is aware when to return to the clinic for a follow-up visit. Patient educated on when it is appropriate to go to the emergency department.   Monia Pouch, FNP-C [facility] [phone]

## 2019-04-23 NOTE — Patient Instructions (Signed)
It was a pleasure seeing you today, Maria Camacho.  Information regarding what we discussed is included in this packet.  Please make an appointment to see me in 4 weeks for knee recheck.   In a few days you may receive a survey in the mail or online from [employer] regarding your visit with [place] today. Please take a moment to fill this out. Your feedback is very important to our office. It can help [place] better understand your needs as well as improve your experience and satisfaction. Thank you for taking your time to complete it. We care about you.    Knee Injection A knee injection is a procedure to get medicine into your knee joint to relieve the pain, swelling, and stiffness of arthritis. Your health care provider uses a needle to inject medicine, which may also help to lubricate and cushion your knee joint. You may need more than one injection. Tell a health care provider about:  Any allergies you have.  All medicines you are taking, including vitamins, herbs, eye drops, creams, and over-the-counter medicines.  Any problems you or family members have had with anesthetic medicines.  Any blood disorders you have.  Any surgeries you have had.  Any medical conditions you have.  Whether you are pregnant or may be pregnant. What are the risks? Generally, this is a safe procedure. However, problems may occur, including:  Infection.  Bleeding.  Symptoms that get worse.  Damage to the area around your knee.  Allergic reaction to any of the medicines.  Skin reactions from repeated injections. What happens before the procedure?  Ask your health care provider about changing or stopping your regular medicines. This is especially important if you are taking diabetes medicines or blood thinners.  Plan to have someone take you home from the hospital or clinic. What happens during the procedure?   You will sit or lie down in a position for your knee to be treated.  The skin over your kneecap  will be cleaned with a germ-killing soap.  You will be given a medicine that numbs the area (local anesthetic). You may feel some stinging.  The medicine will be injected into your knee. The needle is carefully placed between your kneecap and your knee. The medicine is injected into the joint space.  The needle will be removed at the end of the procedure.  A bandage (dressing) may be placed over the injection site. The procedure may vary among health care providers and hospitals. What can I expect after the procedure?  Your blood pressure, heart rate, breathing rate, and blood oxygen level will be monitored until you leave the hospital or clinic.  You may have to move your knee through its full range of motion. This helps to get all the medicine into your joint space.  You will be watched to make sure that you do not have a reaction to the injected medicine.  You may feel more pain, swelling, and warmth than you did before the injection. This reaction may last about 1-2 days. Follow these instructions at home: Medicines  Take over-the-counter and prescription medicines only as told by your doctor.  Do not drive or use heavy machinery while taking prescription pain medicine.  Do not take medicines such as aspirin and ibuprofen unless your health care provider tells you to take them. Injection site care  Follow instructions from your health care provider about: ? How to take care of your puncture site. ? When and how you  should change your dressing. ? When you should remove your dressing.  Check your injection area every day for signs of infection. Check for: ? More redness, swelling, or pain after 2 days. ? Fluid or blood. ? Pus or a bad smell. ? Warmth. Managing pain, stiffness, and swelling   If directed, put ice on the injection area: ? Put ice in a plastic bag. ? Place a towel between your skin and the bag. ? Leave the ice on for 20 minutes, 2-3 times per day.  Do  not apply heat to your knee.  Raise (elevate) the injection area above the level of your heart while you are sitting or lying down. General instructions  If you were given a dressing, keep it dry until your health care provider says it can be removed. Ask your health care provider when you can start showering or taking a bath.  Avoid strenuous activities for as long as directed by your health care provider. Ask your health care provider when you can return to your normal activities.  Keep all follow-up visits as told by your health care provider. This is important. You may need more injections. Contact a health care provider if you have:  A fever.  Warmth in your injection area.  Fluid, blood, or pus coming from your injection site.  Symptoms at your injection site that last longer than 2 days after your procedure. Get help right away if:  Your knee: ? Turns very red. ? Becomes very swollen. ? Is in severe pain. Summary  A knee injection is a procedure to get medicine into your knee joint to relieve the pain, swelling, and stiffness of arthritis.  A needle is carefully placed between your kneecap and your knee to inject medicine into the joint space.  Before the procedure, ask your health care provider about changing or stopping your regular medicines, especially if you are taking diabetes medicines or blood thinners.  Contact your health care provider if you have any problems or questions after your procedure. This information is not intended to replace advice given to you by your health care provider. Make sure you discuss any questions you have with your health care provider. Document Released: 01/02/2007 Document Revised: 10/31/2017 Document Reviewed: 10/31/2017 Elsevier Patient Education  [employer].  Because of recent events of COVID-19 ("Coronavirus"), please follow CDC recommendations:   1. Wash your hand frequently 2. Avoid touching your face 3. Stay away  from people who are sick 4. If you have symptoms such as fever, cough, shortness of breath then call your healthcare provider for further guidance 5. If you are sick, STAY AT HOME, unless otherwise directed by your healthcare provider. 6. Follow directions from state and national officials regarding staying safe    Please feel free to call our office if any questions or concerns arise.  Warm Regards, Monia Pouch, FNP-C [facility] [address] [place], [place] [address] [phone]

## 2019-05-17 ENCOUNTER — Telehealth: Payer: Self-pay | Admitting: Family Medicine

## 2019-05-17 NOTE — Telephone Encounter (Signed)
appt scheduled Pt notified 

## 2019-05-18 ENCOUNTER — Encounter: Payer: Self-pay | Admitting: Family Medicine

## 2019-05-18 ENCOUNTER — Other Ambulatory Visit: Payer: Self-pay

## 2019-05-18 ENCOUNTER — Ambulatory Visit (INDEPENDENT_AMBULATORY_CARE_PROVIDER_SITE_OTHER): Payer: Medicare Other | Admitting: Family Medicine

## 2019-05-18 VITALS — BP 161/78 | HR 63 | Temp 96.6°F | Ht <= 58 in | Wt 149.4 lb

## 2019-05-18 DIAGNOSIS — L600 Ingrowing nail: Secondary | ICD-10-CM | POA: Diagnosis not present

## 2019-05-18 MED ORDER — SULFAMETHOXAZOLE-TRIMETHOPRIM 800-160 MG PO TABS: 1.0000 | ORAL_TABLET | Freq: Two times a day (BID) | ORAL | 0 refills | Status: DC

## 2019-05-18 NOTE — Progress Notes (Signed)
BP (!) 161/78   Pulse 63   Temp (!) 96.6 F (35.9 C) (Temporal)   Ht 4\' 10"  (1.473 m)   Wt 149 lb 6.4 oz (67.8 kg)   BMI 31.22 kg/m    Subjective:   Patient ID: Maria Camacho, female    DOB: 09-Feb-1933, 85 y.o.   MRN: [id number]  HPI: Maria Camacho is a 85 y.o. female presenting on 05/18/2019 for infected finger (left middle finger - x 1 month )   HPI Infected finger/ingrown nail Patient comes in with complaints of an infected finger/ingrown nail that has been going on for a month and has been getting worse.  She says she is been trying to keep it covered and trying to wash it and using peroxide on it but it just does not seem to be improving and actually seems to be worsening.  She has a lot of pain and it is on that medial aspect of her nail of her left middle finger.  She denies any fevers or chills or rash anywhere else remains on that nail.  She is never had this before until this time.  Relevant past medical, surgical, family and social history reviewed and updated as indicated. Interim medical history since our last visit reviewed. Allergies and medications reviewed and updated.  Review of Systems  Constitutional: Negative for chills and fever.  Eyes: Negative for visual disturbance.  Respiratory: Negative for chest tightness and shortness of breath.   Cardiovascular: Negative for chest pain and leg swelling.  Musculoskeletal: Negative for back pain and gait problem.  Skin: Positive for color change and wound. Negative for rash.  Neurological: Negative for light-headedness and headaches.  Psychiatric/Behavioral: Negative for agitation and behavioral problems.  All other systems reviewed and are negative.   Per HPI unless specifically indicated above   Objective:   BP (!) 161/78   Pulse 63   Temp (!) 96.6 F (35.9 C) (Temporal)   Ht 4\' 10"  (1.473 m)   Wt 149 lb 6.4 oz (67.8 kg)   BMI 31.22 kg/m   Wt Readings from Last 3 Encounters:  05/18/19 149 lb 6.4 oz  (67.8 kg)  04/23/19 150 lb (68 kg)  11/07/18 149 lb (67.6 kg)    Physical Exam Vitals signs and nursing note reviewed.  Constitutional:      General: She is not in acute distress.    Appearance: She is well-developed. She is not diaphoretic.  Eyes:     Conjunctiva/sclera: Conjunctivae normal.  Skin:    General: Skin is warm and dry.     Findings: Erythema and wound (Draining purulent wound on the medial aspect of the skin near the nail on the left middle finger consistent with ingrown nail, slightly purulent drainage, very tender to palpation) present. No rash.  Neurological:     Mental Status: She is alert and oriented to person, place, and time.     Coordination: Coordination normal.  Psychiatric:        Behavior: Behavior normal.     Assessment & Plan:   Problem List Items Addressed This Visit    None    Visit Diagnoses    Ingrown nail of left middle finger    -  Primary   Relevant Medications   sulfamethoxazole-trimethoprim (BACTRIM DS) 800-160 MG tablet      Will treat with antibiotic and recommended wedging and soaks and if not improved then return and we may need to do a partial nail removal if does  not improve. Follow up plan: Return if symptoms worsen or fail to improve.  Counseling provided for all of the vaccine components No orders of the defined types were placed in this encounter.   Caryl Pina, MD [facility] 05/18/2019, 10:55 AM

## 2019-05-21 ENCOUNTER — Other Ambulatory Visit: Payer: Self-pay

## 2019-05-21 ENCOUNTER — Ambulatory Visit (INDEPENDENT_AMBULATORY_CARE_PROVIDER_SITE_OTHER): Payer: Medicare Other | Admitting: Family Medicine

## 2019-05-21 ENCOUNTER — Encounter: Payer: Self-pay | Admitting: Family Medicine

## 2019-05-21 VITALS — BP 140/80 | HR 69 | Temp 97.1°F | Ht <= 58 in | Wt 149.0 lb

## 2019-05-21 DIAGNOSIS — G8929 Other chronic pain: Secondary | ICD-10-CM

## 2019-05-21 DIAGNOSIS — L600 Ingrowing nail: Secondary | ICD-10-CM

## 2019-05-21 DIAGNOSIS — W57XXXD Bitten or stung by nonvenomous insect and other nonvenomous arthropods, subsequent encounter: Secondary | ICD-10-CM | POA: Diagnosis not present

## 2019-05-21 DIAGNOSIS — M25562 Pain in left knee: Secondary | ICD-10-CM

## 2019-05-21 MED ORDER — TRIAMCINOLONE ACETONIDE 0.1 % EX CREA: 1.0000 "application " | TOPICAL_CREAM | Freq: Two times a day (BID) | CUTANEOUS | 0 refills | Status: DC

## 2019-05-21 NOTE — Patient Instructions (Addendum)
It was a pleasure seeing you today, Maria Camacho.  Information regarding what we discussed is included in this packet.  Please make an appointment to see me in 3 months.   In a few days you may receive a survey in the mail or online from [employer] regarding your visit with [place] today. Please take a moment to fill this out. Your feedback is very important to our office. It can help [place] better understand your needs as well as improve your experience and satisfaction. Thank you for taking your time to complete it. We care about you.   Acute Knee Pain, Adult Acute knee pain is sudden and may be caused by damage, swelling, or irritation of the muscles and tissues that support your knee. The injury may result from:  A fall.  An injury to your knee from twisting motions.  A hit to the knee.  Infection. Acute knee pain may go away on its own with time and rest. If it does not, your health care provider may order tests to find the cause of the pain. These may include:  Imaging tests, such as an X-ray, MRI, or ultrasound.  Joint aspiration. In this test, fluid is removed from the knee.  Arthroscopy. In this test, a lighted tube is inserted into the knee and an image is projected onto a TV screen.  Biopsy. In this test, a sample of tissue is removed from the body and studied under a microscope. Follow these instructions at home: Pay attention to any changes in your symptoms. Take these actions to relieve your pain. If you have a knee sleeve or brace:   Wear the sleeve or brace as told by your health care provider. Remove it only as told by your health care provider.  Loosen the sleeve or brace if your toes tingle, become numb, or turn cold and blue.  Keep the sleeve or brace clean.  If the sleeve or brace is not waterproof: ? Do not let it get wet. ? Cover it with a watertight covering when you take a bath or shower. Activity  Rest your knee.  Do not do things that cause pain or make pain  worse.  Avoid high-impact activities or exercises, such as running, jumping rope, or doing jumping jacks.  Work with a physical therapist to make a safe exercise program, as recommended by your health care provider. Do exercises as told by your physical therapist. Managing pain, stiffness, and swelling   If directed, put ice on the knee: ? Put ice in a plastic bag. ? Place a towel between your skin and the bag. ? Leave the ice on for 20 minutes, 2-3 times a day.  If directed, use an elastic bandage to put pressure (compression) on your injured knee. This may control swelling, give support, and help with discomfort. General instructions  Take over-the-counter and prescription medicines only as told by your health care provider.  Raise (elevate) your knee above the level of your heart when you are sitting or lying down.  Sleep with a pillow under your knee.  Do not use any products that contain nicotine or tobacco, such as cigarettes, e-cigarettes, and chewing tobacco. These can delay healing. If you need help quitting, ask your health care provider.  If you are overweight, work with your health care provider and a dietitian to set a weight-loss goal that is healthy and reasonable for you. Extra weight can put pressure on your knee.  Keep all follow-up visits as told  by your health care provider. This is important. Contact a health care provider if:  Your knee pain continues, changes, or gets worse.  You have a fever along with knee pain.  Your knee feels warm to the touch.  Your knee buckles or locks up. Get help right away if:  Your knee swells, and the swelling becomes worse.  You cannot move your knee.  You have severe pain in your knee. Summary  Acute knee pain can be caused by a fall, an injury, an infection, or damage, swelling, or irritation of the tissues that support your knee.  Your health care provider may perform tests to find out the cause of the  pain.  Pay attention to any changes in your symptoms. Relieve your pain with rest, medicines, light activity, and use of ice.  Get help if your pain continues or becomes worse, your knee swells, or you cannot move your knee. This information is not intended to replace advice given to you by your health care provider. Make sure you discuss any questions you have with your health care provider. Document Released: 08/08/2007 Document Revised: 03/23/2018 Document Reviewed: 03/23/2018 Elsevier Patient Education  2020 [employer].   Because of recent events of COVID-19 ("Coronavirus"), please follow CDC recommendations:   1. Wash your hand frequently 2. Avoid touching your face 3. Stay away from people who are sick 4. If you have symptoms such as fever, cough, shortness of breath then call your healthcare provider for further guidance 5. If you are sick, STAY AT HOME, unless otherwise directed by your healthcare provider. 6. Follow directions from state and national officials regarding staying safe    Please feel free to call our office if any questions or concerns arise.  Warm Regards, Monia Pouch, FNP-C [facility] [address] [place], [place] [address] [phone]

## 2019-05-21 NOTE — Progress Notes (Signed)
Subjective:  Patient ID: Maria Camacho, female    DOB: [birth year], 85 y.o.   MRN: [id number]  Patient Care Team: Chipper Herb, MD as PCP - General (Family Medicine) Kerry Kass, MD as Referring Physician (Surgery) Christy Sartorius, MD as Referring Physician (Urology) Marygrace Drought, MD as Consulting Physician (Ophthalmology) Steffanie Rainwater, DPM as Consulting Physician (Podiatry)   Chief Complaint:  Knee Pain (4 week - some better) and finger infection (follow up )   HPI: Maria Camacho is a 85 y.o. female presenting on 05/21/2019 for Knee Pain (4 week - some better) and finger infection (follow up )    1. Chronic pain of left knee  Pt following up today for left knee pain. Pt has joint injection on 04/23/2019. Pt states her knee is not as stiff but still hurts. She states she is able to go up and down the stairs easier. She reports continued aching of the joint. No swelling or erythema.    2. Ingrown nail of left middle finger  Seen by Dr. Warrick Parisian on 05/18/2019 for ingrown nail of the left middle finger. Pt was placed on bactrim at that time. Pt states she still has slight swelling and erythema to the finger. No drainage. Finger is tender to palpation.    3. Tick bite, subsequent encounter  Pt was seen in the ED at [facility] for joint pain and rash associated to a tick biter to her right lower back. Pt has RMSF and Lyme titers completed that were negative. Pt states she still has a red raised spot where the tick was located. Pt states the site is pruritic. No fever, chills, continued joint pain, or fatigue.      Relevant past medical, surgical, family, and social history reviewed and updated as indicated.  Allergies and medications reviewed and updated. Date reviewed: Chart in Epic.   Past Medical History:  Diagnosis Date  . Allergy    seasonal   . Cancer ([place])    kidney right  . Cataract    see opth note from 09/2013  . Glaucoma 09/2013   open angle, low  risk  . Hyperlipidemia   . Hypertension   . Osteopenia    dexa 09/2013  . Vertigo 2011    Past Surgical History:  Procedure Laterality Date  . ABDOMINAL HYSTERECTOMY  1979  . ACNE CYST REMOVAL  over 20 yrs. ago   Fatty tiisue of neck  . KNEE ARTHROSCOPY  08/10/2012   Procedure: ARTHROSCOPY KNEE;  Surgeon: Johnn Hai, MD;  Location: [facility];  Service: Orthopedics;  Laterality: Left;  WITH DEBRIDEMENT  . NM PET DX LYMPHOMA    . right kidney cancer surgery  2006    Social History   Socioeconomic History  . Marital status: Married    Spouse name: Not on file  . Number of children: 3  . Years of education: Not on file  . Highest education level: Not on file  Occupational History  . Occupation: Retired    Comment: Tour manager  . Financial resource strain: Not hard at all  . Food insecurity    Worry: Never true    Inability: Never true  . Transportation needs    Medical: No    Non-medical: No  Tobacco Use  . Smoking status: Never Smoker  . Smokeless tobacco: Never Used  Substance and Sexual Activity  . Alcohol use: No  . Drug use: No  . Sexual  activity: Not on file  Lifestyle  . Physical activity    Days per week: 3 days    Minutes per session: 30 min  . Stress: Only a little  Relationships  . Social connections    Talks on phone: More than three times a week    Gets together: More than three times a week    Attends religious service: More than 4 times per year    Active member of club or organization: Yes    Attends meetings of clubs or organizations: More than 4 times per year    Relationship status: Married  . Intimate partner violence    Fear of current or ex partner: No    Emotionally abused: No    Physically abused: No    Forced sexual activity: No  Other Topics Concern  . Not on file  Social History Narrative   Lives at home with husband - married almost 38 years  (2019)    Outpatient Encounter Medications as of 05/21/2019  Medication  Sig  . acetaminophen (TYLENOL) 500 MG tablet Take 500 mg by mouth as needed. Reported on 12/29/2015  . amLODipine-valsartan (EXFORGE) 10-320 MG tablet Take 0.5 tablets by mouth daily.  . B Complex-C-E-Zn (BEC/ZINC) TABS Take 1 tablet by mouth daily.   . bimatoprost (LUMIGAN) 0.01 % SOLN Place 1 drop into both eyes at bedtime.   . chlorpheniramine (ALLERGY) 4 MG tablet Take 4 mg by mouth daily as needed for allergies.  . Cholecalciferol (VITAMIN D) 2000 UNITS tablet Take 2,000 Units by mouth daily. Take 2000IU daily M-F and 4000IU Sat and Sun  . conjugated estrogens (PREMARIN) vaginal cream Place 0.5 Applicatorfuls vaginally daily as needed. Reported on 03/01/2016  . dorzolamide-timolol (COSOPT) 22.3-6.8 MG/ML ophthalmic solution Place 1 drop into both eyes daily.   . fish oil-omega-3 fatty acids 1000 MG capsule Take 1 g by mouth daily.  . NON FORMULARY Probiotic with cranberry  . sulfamethoxazole-trimethoprim (BACTRIM DS) 800-160 MG tablet Take 1 tablet by mouth 2 (two) times daily.  Marland Kitchen triamcinolone cream (KENALOG) 0.1 % Apply 1 application topically 2 (two) times daily.   Facility-Administered Encounter Medications as of 05/21/2019  Medication  . lidocaine (XYLOCAINE) 2 % (with pres) injection 60 mg    Allergies  Allergen Reactions  . Actonel [Risedronate Sodium] Other (See Comments)    dizziness  . Bacitracin Rash  . Livalo [Pitavastatin] Nausea Only  . Oxytrol [Oxybutynin] Rash  . Penicillins     REACTION: hives    Review of Systems  Constitutional: Negative for activity change, appetite change, chills, diaphoresis, fatigue, fever and unexpected weight change.  Eyes: Negative for photophobia and visual disturbance.  Respiratory: Negative for cough and shortness of breath.   Cardiovascular: Negative for chest pain, palpitations and leg swelling.  Gastrointestinal: Negative for abdominal pain, constipation, diarrhea, nausea and vomiting.  Genitourinary: Negative for decreased urine  volume and difficulty urinating.  Musculoskeletal: Positive for arthralgias and joint swelling. Negative for gait problem and myalgias.  Skin: Positive for rash and wound.  Neurological: Negative for dizziness, tremors, seizures, syncope, facial asymmetry, speech difficulty, weakness, light-headedness, numbness and headaches.  Psychiatric/Behavioral: Negative for confusion.  All other systems reviewed and are negative.       Objective:  BP 140/80   Pulse 69   Temp (!) 97.1 F (36.2 C)   Ht 4\' 10"  (1.473 m)   Wt 149 lb (67.6 kg)   BMI 31.14 kg/m    Wt Readings from Last 3  Encounters:  05/21/19 149 lb (67.6 kg)  05/18/19 149 lb 6.4 oz (67.8 kg)  04/23/19 150 lb (68 kg)    Physical Exam Vitals signs and nursing note reviewed.  Constitutional:      General: She is not in acute distress.    Appearance: Normal appearance. She is well-developed and well-groomed. She is not ill-appearing, toxic-appearing or diaphoretic.  HENT:     Head: Normocephalic and atraumatic.     Jaw: There is normal jaw occlusion.     Right Ear: Hearing normal.     Left Ear: Hearing normal.     Nose: Nose normal.     Mouth/Throat:     Lips: Pink.     Mouth: Mucous membranes are moist.     Pharynx: Oropharynx is clear. Uvula midline.  Eyes:     General: Lids are normal.     Extraocular Movements: Extraocular movements intact.     Conjunctiva/sclera: Conjunctivae normal.     Pupils: Pupils are equal, round, and reactive to light.  Neck:     Musculoskeletal: Normal range of motion and neck supple.     Thyroid: No thyroid mass, thyromegaly or thyroid tenderness.     Vascular: No carotid bruit or JVD.     Trachea: Trachea and phonation normal.  Cardiovascular:     Rate and Rhythm: Normal rate and regular rhythm.     Chest Wall: PMI is not displaced.     Pulses: Normal pulses.     Heart sounds: Normal heart sounds. No murmur. No friction rub. No gallop.   Pulmonary:     Effort: Pulmonary effort is  normal. No respiratory distress.     Breath sounds: Normal breath sounds. No wheezing.  Abdominal:     General: Bowel sounds are normal. There is no distension or abdominal bruit.     Palpations: Abdomen is soft. There is no hepatomegaly or splenomegaly.     Tenderness: There is no abdominal tenderness. There is no right CVA tenderness or left CVA tenderness.     Hernia: No hernia is present.  Musculoskeletal:     Left hip: Normal.     Left knee: She exhibits decreased range of motion. She exhibits no swelling, no effusion, no ecchymosis, no deformity, no laceration, no erythema, normal alignment, no LCL laxity, normal patellar mobility, no bony tenderness, normal meniscus and no MCL laxity. Tenderness found.     Left ankle: Normal.     Right lower leg: No edema.     Left lower leg: No edema.  Lymphadenopathy:     Cervical: No cervical adenopathy.  Skin:    General: Skin is warm and dry.     Capillary Refill: Capillary refill takes less than 2 seconds.     Coloration: Skin is not cyanotic, jaundiced or pale.     Findings: Wound (healing ingrown nail of left middle finger, slight erythema and swelling with mild tenderness. No drainage. ) present. No rash.       Neurological:     General: No focal deficit present.     Mental Status: She is alert and oriented to person, place, and time.     Cranial Nerves: Cranial nerves are intact.     Sensory: Sensation is intact.     Motor: Motor function is intact.     Coordination: Coordination is intact.     Gait: Gait is intact.     Deep Tendon Reflexes: Reflexes are normal and symmetric.  Psychiatric:  Attention and Perception: Attention and perception normal.        Mood and Affect: Mood and affect normal.        Speech: Speech normal.        Behavior: Behavior normal. Behavior is cooperative.        Thought Content: Thought content normal.        Cognition and Memory: Cognition and memory normal.        Judgment: Judgment normal.      Results for orders placed or performed in visit on 11/07/18  Pathology  Result Value Ref Range   . Comment    . Comment    . Comment    . Comment    . Comment    . Comment    . Comment    . Comment        Pertinent labs & imaging results that were available during my care of the patient were reviewed by me and considered in my medical decision making.  Assessment & Plan:  Maria Camacho was seen today for knee pain and finger infection.  Diagnoses and all orders for this visit:  Chronic pain of left knee Symptomatic care discussed. Referral to PT. Report any new or worsening symptoms.  -     Ambulatory referral to Physical Therapy  Ingrown nail of left middle finger Healing. Continue Bactrim. Report any new or worsening symptoms.   Tick bite, subsequent encounter RMSF and Lyme titers negative in ED. Will treat lesion with Kenalog cream. Not concerning for cellulitis. Report any new or worsening symptoms.  -     triamcinolone cream (KENALOG) 0.1 %; Apply 1 application topically 2 (two) times daily.     Continue all other maintenance medications.  Follow up plan: Return in about 3 months (around 08/21/2019), or if symptoms worsen or fail to improve, for HTN, Labs.  Educational handout given for Survey, COVID-19, Knee pain  The above assessment and management plan was discussed with the patient. The patient verbalized understanding of and has agreed to the management plan. Patient is aware to call the clinic if symptoms persist or worsen. Patient is aware when to return to the clinic for a follow-up visit. Patient educated on when it is appropriate to go to the emergency department.   Monia Pouch, FNP-C [facility] [phone] 05/21/19

## 2019-05-28 ENCOUNTER — Encounter: Payer: Self-pay | Admitting: Physical Therapy

## 2019-05-28 ENCOUNTER — Ambulatory Visit: Payer: Medicare Other | Attending: Family Medicine | Admitting: Physical Therapy

## 2019-05-28 ENCOUNTER — Other Ambulatory Visit: Payer: Self-pay

## 2019-05-28 DIAGNOSIS — M6281 Muscle weakness (generalized): Secondary | ICD-10-CM

## 2019-05-28 DIAGNOSIS — M25562 Pain in left knee: Secondary | ICD-10-CM
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
## 2019-05-31 ENCOUNTER — Other Ambulatory Visit: Payer: Self-pay

## 2019-05-31 ENCOUNTER — Ambulatory Visit: Payer: Medicare Other | Admitting: *Deleted

## 2019-05-31 DIAGNOSIS — M25562 Pain in left knee: Secondary | ICD-10-CM | POA: Diagnosis not present

## 2019-05-31 DIAGNOSIS — M6281 Muscle weakness (generalized): Secondary | ICD-10-CM
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
## 2019-06-04 ENCOUNTER — Ambulatory Visit: Payer: Medicare Other | Admitting: *Deleted

## 2019-06-04 ENCOUNTER — Other Ambulatory Visit: Payer: Self-pay

## 2019-06-04 DIAGNOSIS — M25562 Pain in left knee: Secondary | ICD-10-CM | POA: Diagnosis not present

## 2019-06-04 DIAGNOSIS — M6281 Muscle weakness (generalized): Secondary | ICD-10-CM

## 2019-06-04 NOTE — Therapy (Deleted)
[facility] [place], [place], [address] Phone: [phone]   Fax:  [phone]  June 04, 2019   @[email or web address] @  Physical Therapy Discharge Summary  Patient: Maria Camacho  MRN: [id number]  Date of Birth: 1932-12-06   Diagnosis: Acute pain of left knee  Muscle weakness (generalized) Referring Provider (PT): Darla Lesches   The above patient had been seen in Physical Therapy *** times of *** treatments scheduled with *** no shows and *** cancellations.  The treatment consisted of *** The patient is: {improved/worse/unchanged:[id number]}  Subjective: ***  Discharge Findings: ***  Functional Status at Discharge: ***  {HYIFO:[id number]}  Plan - 06/04/19 0921    Clinical Impression Statement  Pt arrived today doing fair. She reports  doing well after last Rx, but having more pain today in RT hip and knee over the weekend. She was able to complete all therex again with added  LAQs and SAQs. Normal modality response today and Estim tolerated well.    Personal Factors and Comorbidities  Age;Comorbidity 1    Comorbidities  Left knee ATS.    Examination-Activity Limitations  Squat;Other;Locomotion Level    Stability/Clinical Decision Making  Stable/Uncomplicated    Rehab Potential  Good    PT Frequency  2x / week    PT Duration  6 weeks    PT Treatment/Interventions  ADLs/Self Care Home Management;Cryotherapy;Electrical Stimulation;Ultrasound;Moist Heat;Stair training;Functional mobility training;Therapeutic activities;Therapeutic exercise;Manual techniques;Patient/family education;Passive range of motion;Vasopneumatic Device    PT Next Visit Plan  PAIN-FREE left quadriceps strengthening (O and CKC), left hip abduction strengthening.  Modalites PRN    Consulted and Agree with Plan of Care  Patient       Sincerely,   Vayda Dungee,CHRIS, PTA   CC [email or web address] @  [facility] [address] [place], [place], [address] Phone: [phone]   Fax:  [phone]  Patient: Maria Camacho  MRN: [id number]  Date of Birth: 06-30-1933
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
## 2019-06-07 ENCOUNTER — Other Ambulatory Visit: Payer: Self-pay

## 2019-06-07 ENCOUNTER — Ambulatory Visit: Payer: Medicare Other | Admitting: *Deleted

## 2019-06-07 DIAGNOSIS — M25562 Pain in left knee: Secondary | ICD-10-CM

## 2019-06-07 DIAGNOSIS — M6281 Muscle weakness (generalized): Secondary | ICD-10-CM
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
## 2019-06-07 NOTE — Therapy (Signed)
[facility] [place], [place], [address] Phone: [phone]   Fax:  [phone]  Physical Therapy Treatment  Patient Details  Name: Maria Camacho MRN: [id number] Date of Birth: [birth year] Referring Provider (PT): Darla Lesches   Encounter Date: 06/04/2019    Past Medical History:  Diagnosis Date  . Allergy    seasonal   . Cancer ([place])    kidney right  . Cataract    see opth note from 09/2013  . Glaucoma 09/2013   open angle, low risk  . Hyperlipidemia   . Hypertension   . Osteopenia    dexa 09/2013  . Vertigo 2011    Past Surgical History:  Procedure Laterality Date  . ABDOMINAL HYSTERECTOMY  1979  . ACNE CYST REMOVAL  over 20 yrs. ago   Fatty tiisue of neck  . KNEE ARTHROSCOPY  08/10/2012   Procedure: ARTHROSCOPY KNEE;  Surgeon: Johnn Hai, MD;  Location: [facility];  Service: Orthopedics;  Laterality: Left;  WITH DEBRIDEMENT  . NM PET DX LYMPHOMA    . right kidney cancer surgery  2006    There were no vitals filed for this visit.                              PT Short Term Goals - 05/28/19 1117      PT SHORT TERM GOAL #1   Title  STG's=LTG's.        PT Long Term Goals - 06/07/19 0913      PT LONG TERM GOAL #1   Title  Independent with a HEP.    Time  6    Period  Weeks    Status  On-going      PT LONG TERM GOAL #2   Title  Walk a community distance with pain not > 2-3/10    Time  6    Period  Weeks    Status  On-going      PT LONG TERM GOAL #3   Title  Perform a reciprocating stair gait with one railing with pain not > 2-3/10.    Time  6    Period  Weeks    Status  Partially Met      PT LONG TERM GOAL #4   Title  Increase knee and hip strength to 5/5 to provide good stability for accomplishment of functional activities.    Time  6    Period  Weeks    Status  On-going              Patient will benefit from skilled therapeutic intervention in order  to improve the following deficits and impairments:  Pain, Decreased strength, Decreased activity tolerance  Visit Diagnosis: 1. Acute pain of left knee   2. Muscle weakness (generalized)        Problem List Patient Active Problem List   Diagnosis Date Noted  . Chronic pain of left knee 04/23/2019  . Abnormal EKG 07/12/2018  . Dyslipidemia 07/12/2018  . History of kidney cancer 01/16/2015  . Atrophic vaginitis 06/10/2014  . Bladder prolapse, female, acquired 05/16/2014  . Vitamin D deficiency 05/16/2014  . Cystocele 05/15/2014  . OAB (overactive bladder) 05/15/2014  . Osteopenia 10/10/2013  . Hyperlipidemia 03/14/2013  . Hypertension 03/14/2013  . History of renal carcinoma 02/06/2013  . Encounter for routine gynecological examination 02/06/2013  . Postmenopausal vaginal bleeding 02/06/2013  . Hemorrhoid 02/06/2013  .  Memory loss 02/22/2012    RAMSEUR,CHRIS 06/07/2019, 9:36 AM  [facility] [address] [place], [place], [address] Phone: [phone]   Fax:  [phone]  Name: Maria Camacho MRN: [id number] Date of Birth: 1933/07/22

## 2019-06-11 ENCOUNTER — Ambulatory Visit: Payer: Medicare Other | Admitting: Physical Therapy

## 2019-06-11 ENCOUNTER — Other Ambulatory Visit: Payer: Self-pay

## 2019-06-11 DIAGNOSIS — M25562 Pain in left knee: Secondary | ICD-10-CM

## 2019-06-11 DIAGNOSIS — M6281 Muscle weakness (generalized): Secondary | ICD-10-CM
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
## 2019-06-14 ENCOUNTER — Other Ambulatory Visit: Payer: Self-pay

## 2019-06-14 ENCOUNTER — Ambulatory Visit: Payer: Medicare Other | Admitting: *Deleted

## 2019-06-14 DIAGNOSIS — M25562 Pain in left knee: Secondary | ICD-10-CM | POA: Diagnosis not present

## 2019-06-14 DIAGNOSIS — M6281 Muscle weakness (generalized): Secondary | ICD-10-CM
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
## 2019-06-18 ENCOUNTER — Other Ambulatory Visit: Payer: Self-pay

## 2019-06-18 ENCOUNTER — Encounter: Payer: Self-pay | Admitting: Physical Therapy

## 2019-06-18 ENCOUNTER — Ambulatory Visit: Payer: Medicare Other | Admitting: Physical Therapy

## 2019-06-18 DIAGNOSIS — M6281 Muscle weakness (generalized): Secondary | ICD-10-CM

## 2019-06-18 DIAGNOSIS — M25562 Pain in left knee: Secondary | ICD-10-CM | POA: Diagnosis not present
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
## 2019-06-21 ENCOUNTER — Other Ambulatory Visit: Payer: Self-pay

## 2019-06-21 ENCOUNTER — Ambulatory Visit: Payer: Medicare Other | Admitting: *Deleted

## 2019-06-21 DIAGNOSIS — M25562 Pain in left knee: Secondary | ICD-10-CM | POA: Diagnosis not present

## 2019-06-21 DIAGNOSIS — M6281 Muscle weakness (generalized): Secondary | ICD-10-CM
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
## 2019-06-22 ENCOUNTER — Encounter: Payer: Medicare Other | Admitting: *Deleted

## 2019-06-25 ENCOUNTER — Other Ambulatory Visit: Payer: Self-pay

## 2019-06-25 ENCOUNTER — Ambulatory Visit: Payer: Medicare Other | Admitting: Physical Therapy

## 2019-06-25 DIAGNOSIS — M25562 Pain in left knee: Secondary | ICD-10-CM | POA: Diagnosis not present

## 2019-06-25 DIAGNOSIS — M6281 Muscle weakness (generalized): Secondary | ICD-10-CM
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
## 2019-06-28 ENCOUNTER — Encounter: Payer: Self-pay | Admitting: Physical Therapy

## 2019-06-28 ENCOUNTER — Other Ambulatory Visit: Payer: Self-pay

## 2019-06-28 ENCOUNTER — Ambulatory Visit: Payer: Medicare Other | Attending: Family Medicine | Admitting: Physical Therapy

## 2019-06-28 DIAGNOSIS — M6281 Muscle weakness (generalized): Secondary | ICD-10-CM | POA: Diagnosis present

## 2019-06-28 DIAGNOSIS — M25562 Pain in left knee: Secondary | ICD-10-CM | POA: Insufficient documentation
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
## 2019-07-03 ENCOUNTER — Ambulatory Visit: Payer: Medicare Other | Admitting: Physical Therapy

## 2019-07-03 ENCOUNTER — Encounter: Payer: Self-pay | Admitting: Physical Therapy

## 2019-07-03 ENCOUNTER — Other Ambulatory Visit: Payer: Self-pay

## 2019-07-03 DIAGNOSIS — M25562 Pain in left knee: Secondary | ICD-10-CM | POA: Diagnosis not present

## 2019-07-03 DIAGNOSIS — M6281 Muscle weakness (generalized): Secondary | ICD-10-CM
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
## 2019-07-05 ENCOUNTER — Ambulatory Visit: Payer: Medicare Other | Admitting: Physical Therapy

## 2019-07-05 ENCOUNTER — Other Ambulatory Visit: Payer: Self-pay

## 2019-07-05 DIAGNOSIS — M25562 Pain in left knee: Secondary | ICD-10-CM

## 2019-07-05 DIAGNOSIS — M6281 Muscle weakness (generalized): Secondary | ICD-10-CM
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
## 2019-07-11 ENCOUNTER — Encounter: Payer: Self-pay | Admitting: *Deleted

## 2019-07-18 ENCOUNTER — Other Ambulatory Visit: Payer: Self-pay

## 2019-07-19 ENCOUNTER — Encounter: Payer: Self-pay | Admitting: Family Medicine

## 2019-07-19 ENCOUNTER — Ambulatory Visit (INDEPENDENT_AMBULATORY_CARE_PROVIDER_SITE_OTHER): Payer: Medicare Other | Admitting: Family Medicine

## 2019-07-19 VITALS — BP 133/73 | HR 67 | Temp 97.8°F | Resp 20 | Ht <= 58 in | Wt 148.0 lb

## 2019-07-19 DIAGNOSIS — L03012 Cellulitis of left finger: Secondary | ICD-10-CM | POA: Diagnosis not present

## 2019-07-19 MED ORDER — TRIAMCINOLONE ACETONIDE 0.1 % EX CREA: 1.0000 "application " | TOPICAL_CREAM | Freq: Two times a day (BID) | CUTANEOUS | 0 refills | Status: DC

## 2019-07-19 MED ORDER — CEPHALEXIN 500 MG PO CAPS: 500.0000 mg | ORAL_CAPSULE | Freq: Two times a day (BID) | ORAL | 0 refills | Status: AC

## 2019-07-19 NOTE — Progress Notes (Deleted)
Subjective:  Patient ID: Maria Camacho, female DOB: September 02, 1933, 85 y.o. MRN: [id number]  Patient Care Team:  Dettinger, Fransisca Kaufmann, MD as PCP - General (Family Medicine)  Kerry Kass, MD as Referring Physician (Surgery)  Christy Sartorius, MD as Referring Physician (Urology)  Marygrace Drought, MD as Consulting Physician (Ophthalmology)  Steffanie Rainwater, DPM as Consulting Physician (Podiatry)  Chief Complaint: finger infection (left middle )  HPI:  Maria Camacho is a 85 y.o. female presenting on 07/19/2019 for finger infection (left middle )  [name] presents with pain of the 3rd digit of her left hand x1 month. The pain is located to both sides of the top of the nail. She reports the finger was infected 3 months ago and she was treated with an antibiotic. She reports some relief after lifting the corner of her nail on the left side. Denies fever, drainage, or spreading erythema.  Hand Pain  There was no injury mechanism. The pain does not radiate. The pain is at a severity of 4/10. The pain is moderate. The pain has been intermittent since the incident. Pertinent negatives include no chest pain, muscle weakness, numbness or tingling. The symptoms are aggravated by palpation. Treatments tried: bandage, cortisone cream. The treatment provided mild relief.   No diagnosis found.  Relevant past medical, surgical, family, and social history reviewed and updated as indicated.  Allergies and medications reviewed and updated. Date reviewed: Chart in Epic.      Past Medical History:  Diagnosis Date  . Allergy    seasonal   . Cancer ([place])    kidney right  . Cataract    see opth note from 09/2013  . Glaucoma 09/2013   open angle, low risk  . Hyperlipidemia   . Hypertension   . Osteopenia    dexa 09/2013   . Vertigo 2011        Past Surgical History:  Procedure Laterality Date  . ABDOMINAL HYSTERECTOMY  1979  . ACNE CYST REMOVAL  over 20 yrs. ago   Fatty tiisue of neck  . KNEE ARTHROSCOPY  08/10/2012   Procedure: ARTHROSCOPY KNEE; Surgeon: Johnn Hai, MD; Location: [facility]; Service: Orthopedics; Laterality: Left; WITH DEBRIDEMENT  . NM PET DX LYMPHOMA    . right kidney cancer surgery  2006   Social History        Socioeconomic History  . Marital status: Married    Spouse name: Not on file  . Number of children: 3  . Years of education: Not on file  . Highest education level: Not on file  Occupational History  . Occupation: Retired    Comment: Tour manager  . Financial resource strain: Not hard at all  . Food insecurity    Worry: Never true    Inability: Never true  . Transportation needs    Medical: No    Non-medical: No  Tobacco Use  .  Smoking status: Never Smoker  . Smokeless tobacco: Never Used  Substance and Sexual Activity  . Alcohol use: No  . Drug use: No  . Sexual activity: Not on file  Lifestyle  . Physical activity    Days per week: 3 days    Minutes per session: 30 min  . Stress: Only a little  Relationships  . Social connections    Talks on phone: More than three times a week    Gets together: More than three times a week    Attends religious service: More than 4 times per year    Active member of club or organization: Yes    Attends meetings of clubs or organizations: More than 4 times per year    Relationship status: Married  . Intimate partner violence    Fear of current or ex partner: No    Emotionally abused: No    Physically abused: No    Forced sexual activity: No  Other Topics Concern  . Not on file  Social History Narrative   Lives at home with husband - married almost 1 years (2019)       Outpatient Encounter Medications as of 07/19/2019  Medication Sig  . acetaminophen (TYLENOL) 500 MG tablet Take 500 mg by mouth as  needed. Reported on 12/29/2015  . amLODipine-valsartan (EXFORGE) 10-320 MG tablet Take 0.5 tablets by mouth daily.  . B Complex-C-E-Zn (BEC/ZINC) TABS Take 1 tablet by mouth daily.   . bimatoprost (LUMIGAN) 0.01 % SOLN Place 1 drop into both eyes at bedtime.   . chlorpheniramine (ALLERGY) 4 MG tablet Take 4 mg by mouth daily as needed for allergies.  . Cholecalciferol (VITAMIN D) 2000 UNITS tablet Take 2,000 Units by mouth daily. Take 2000IU daily M-F and 4000IU Sat and Sun  . conjugated estrogens (PREMARIN) vaginal cream Place 0.5 Applicatorfuls vaginally daily as needed. Reported on 03/01/2016  . dorzolamide-timolol (COSOPT) 22.3-6.8 MG/ML ophthalmic solution Place 1 drop into both eyes daily.   . fish oil-omega-3 fatty acids 1000 MG capsule Take 1 g by mouth daily.  . NON FORMULARY Probiotic with cranberry  . triamcinolone cream (KENALOG) 0.1 % Apply 1 application topically 2 (two) times daily.  . [DISCONTINUED] sulfamethoxazole-trimethoprim (BACTRIM DS) 800-160 MG tablet Take 1 tablet by mouth 2 (two) times daily. (Patient not taking: Reported on 05/28/2019)  . [DISCONTINUED] lidocaine (XYLOCAINE) 2 % (with pres) injection 60 mg    No facility-administered encounter medications on file as of 07/19/2019.         Allergies  Allergen Reactions  . Actonel [Risedronate Sodium] Other (See Comments)    dizziness  . Bacitracin Rash  . Livalo [Pitavastatin] Nausea Only  . Oxytrol [Oxybutynin] Rash  . Penicillins     REACTION: hives  Review of Systems  Constitutional: Negative for chills, fever and unexpected weight change.  Respiratory: Negative for shortness of breath.  Cardiovascular: Negative for chest pain.  Skin: Negative for rash.  Neurological: Negative for tingling and numbness.   Objective:  BP 133/73  Pulse 67  Temp 97.8 F (36.6 C)  Resp 20  Ht 4\' 10"  (1.473 m)  Wt 148 lb (67.1 kg)  SpO2 98%  BMI 30.93 kg/m     Wt Readings from Last 3 Encounters:  07/19/19 148 lb (67.1  kg)  05/21/19 149 lb (67.6 kg)  05/18/19 149 lb 6.4 oz (67.8 kg)  Physical Exam  Constitutional:  General: She is not in acute distress. Appearance: Normal appearance. She is not toxic-appearing.  Cardiovascular:  Rate and Rhythm: Normal rate and regular rhythm.  Heart sounds: Normal heart sounds.  Pulmonary:  Effort: Pulmonary effort is normal. No respiratory distress.  Breath sounds: Normal breath sounds.  Musculoskeletal:  Comments: Left hand: Localized erythema, mild swelling, and tenderness to skin along top of nail bilaterally of 3rd digit. No tenderness or erythema to palpation of finger pad. No drainage noted. Full ROM and strength of left hand. Sensation intact.  Skin:  General: Skin is warm and dry.  Capillary Refill: Capillary refill takes less than 2 seconds.  Neurological:  Mental Status: She is alert and oriented to person, place, and time. Mental status is at baseline.  Psychiatric:  Mood and Affect: Mood normal.  Behavior: Behavior normal.        Results for orders placed or performed in visit on 11/07/18  Pathology  Result Value Ref Range   . Comment    . Comment    . Comment    . Comment    . Comment    . Comment    . Comment    . Comment   Pertinent labs & imaging results that were available during my care of the patient were reviewed by me and considered in my medical decision making.   Bernice was seen today for finger infection.  Diagnoses and all orders for this visit:  Paronychia of left middle finger -     cephALEXin (KEFLEX) 500 MG capsule; Take 1 capsule (500 mg total) by mouth 2 (two) times daily for 7 days. -     triamcinolone cream (KENALOG) 0.1 %; Apply 1 application topically 2 (two) times daily.   Return in about 2 weeks (around 08/02/2019), or if symptoms worsen or fail to improve, for finger.   Continue healthy lifestyle choices, including diet (rich in fruits, vegetables, and lean proteins, and low in salt and simple carbohydrates)  and exercise (at least 30 minutes of moderate physical activity daily).  Educational handout given for paronychia.  The above assessment and management plan was discussed with the patient. The patient verbalized understanding of and has agreed to the management plan. Patient is aware to call the clinic if they develop any new symptoms or if symptoms persist or worsen. Patient is aware when to return to the clinic for a follow-up visit. Patient educated on when it is appropriate to go to the emergency department.    Monia Pouch, FNP-C  [facility]  [phone]

## 2019-07-19 NOTE — Progress Notes (Deleted)
Subjective:  Patient ID: Maria Camacho, female    DOB: 03-17-1933, 85 y.o.   MRN: [id number]  Patient Care Team: Dettinger, Fransisca Kaufmann, MD as PCP - General (Family Medicine) Kerry Kass, MD as Referring Physician (Surgery) Christy Sartorius, MD as Referring Physician (Urology) Marygrace Drought, MD as Consulting Physician (Ophthalmology) Steffanie Rainwater, DPM as Consulting Physician (Podiatry)   Chief Complaint:  finger infection (left middle )   HPI: Maria Camacho is a 85 y.o. female presenting on 07/19/2019 for finger infection (left middle )   Evelynn presents with pain of the 3rd digit of her left hand x1 month. The pain is located to both sides of the top of the nail. She reports the finger was infected 3 months ago and she was treated with an antibiotic. She reports some relief after lifting the corner of her nail on the left side. Denies fever, drainage, or spreading erythema.   Hand Pain  There was no injury mechanism. The pain does not radiate. The pain is at a severity of 4/10. The pain is moderate. The pain has been intermittent since the incident. Pertinent negatives include no chest pain, muscle weakness, numbness or tingling. The symptoms are aggravated by palpation. Treatments tried: bandage, cortisone cream. The treatment provided mild relief.   No diagnosis found.   Relevant past medical, surgical, family, and social history reviewed and updated as indicated.  Allergies and medications reviewed and updated. Date reviewed: Chart in Epic.   Past Medical History:  Diagnosis Date  . Allergy    seasonal   . Cancer ([place])    kidney right  . Cataract    see opth note from 09/2013  . Glaucoma 09/2013   open angle, low risk  . Hyperlipidemia   . Hypertension   . Osteopenia    dexa 09/2013  . Vertigo 2011    Past Surgical History:  Procedure Laterality Date  . ABDOMINAL HYSTERECTOMY  1979  . ACNE CYST REMOVAL  over 20 yrs. ago   Fatty tiisue of neck  . KNEE  ARTHROSCOPY  08/10/2012   Procedure: ARTHROSCOPY KNEE;  Surgeon: Johnn Hai, MD;  Location: [facility];  Service: Orthopedics;  Laterality: Left;  WITH DEBRIDEMENT  . NM PET DX LYMPHOMA    . right kidney cancer surgery  2006    Social History   Socioeconomic History  . Marital status: Married    Spouse name: Not on file  . Number of children: 3  . Years of education: Not on file  . Highest education level: Not on file  Occupational History  . Occupation: Retired    Comment: Tour manager  . Financial resource strain: Not hard at all  . Food insecurity    Worry: Never true    Inability: Never true  . Transportation needs    Medical: No    Non-medical: No  Tobacco Use  . Smoking status: Never Smoker  . Smokeless tobacco: Never Used  Substance and Sexual Activity  . Alcohol use: No  . Drug use: No  . Sexual activity: Not on file  Lifestyle  . Physical activity    Days per week: 3 days    Minutes per session: 30 min  . Stress: Only a little  Relationships  . Social connections    Talks on phone: More than three times a week    Gets together: More than three times a week    Attends religious service: More  than 4 times per year    Active member of club or organization: Yes    Attends meetings of clubs or organizations: More than 4 times per year    Relationship status: Married  . Intimate partner violence    Fear of current or ex partner: No    Emotionally abused: No    Physically abused: No    Forced sexual activity: No  Other Topics Concern  . Not on file  Social History Narrative   Lives at home with husband - married almost 40 years  (2019)    Outpatient Encounter Medications as of 07/19/2019  Medication Sig  . acetaminophen (TYLENOL) 500 MG tablet Take 500 mg by mouth as needed. Reported on 12/29/2015  . amLODipine-valsartan (EXFORGE) 10-320 MG tablet Take 0.5 tablets by mouth daily.  . B Complex-C-E-Zn (BEC/ZINC) TABS Take 1 tablet by mouth daily.    . bimatoprost (LUMIGAN) 0.01 % SOLN Place 1 drop into both eyes at bedtime.   . chlorpheniramine (ALLERGY) 4 MG tablet Take 4 mg by mouth daily as needed for allergies.  . Cholecalciferol (VITAMIN D) 2000 UNITS tablet Take 2,000 Units by mouth daily. Take 2000IU daily M-F and 4000IU Sat and Sun  . conjugated estrogens (PREMARIN) vaginal cream Place 0.5 Applicatorfuls vaginally daily as needed. Reported on 03/01/2016  . dorzolamide-timolol (COSOPT) 22.3-6.8 MG/ML ophthalmic solution Place 1 drop into both eyes daily.   . fish oil-omega-3 fatty acids 1000 MG capsule Take 1 g by mouth daily.  . NON FORMULARY Probiotic with cranberry  . triamcinolone cream (KENALOG) 0.1 % Apply 1 application topically 2 (two) times daily.  . [DISCONTINUED] sulfamethoxazole-trimethoprim (BACTRIM DS) 800-160 MG tablet Take 1 tablet by mouth 2 (two) times daily. (Patient not taking: Reported on 05/28/2019)  . [DISCONTINUED] lidocaine (XYLOCAINE) 2 % (with pres) injection 60 mg    No facility-administered encounter medications on file as of 07/19/2019.     Allergies  Allergen Reactions  . Actonel [Risedronate Sodium] Other (See Comments)    dizziness  . Bacitracin Rash  . Livalo [Pitavastatin] Nausea Only  . Oxytrol [Oxybutynin] Rash  . Penicillins     REACTION: hives    Review of Systems  Constitutional: Negative for chills, fever and unexpected weight change.  Respiratory: Negative for shortness of breath.   Cardiovascular: Negative for chest pain.  Skin: Negative for rash.  Neurological: Negative for tingling and numbness.        Objective:  BP 133/73   Pulse 67   Temp 97.8 F (36.6 C)   Resp 20   Ht 4\' 10"  (1.473 m)   Wt 148 lb (67.1 kg)   SpO2 98%   BMI 30.93 kg/m    Wt Readings from Last 3 Encounters:  07/19/19 148 lb (67.1 kg)  05/21/19 149 lb (67.6 kg)  05/18/19 149 lb 6.4 oz (67.8 kg)    Physical Exam Constitutional:      General: She is not in acute distress.    Appearance:  Normal appearance. She is not toxic-appearing.  Cardiovascular:     Rate and Rhythm: Normal rate and regular rhythm.     Heart sounds: Normal heart sounds.  Pulmonary:     Effort: Pulmonary effort is normal. No respiratory distress.     Breath sounds: Normal breath sounds.  Musculoskeletal:     Comments: Left hand: Localized erythema, mild swelling, and tenderness to skin along top of nail bilaterally of 3rd digit. No tenderness or erythema to palpation of finger pad.  No drainage noted. Full ROM and strength of left hand. Sensation intact.   Skin:    General: Skin is warm and dry.     Capillary Refill: Capillary refill takes less than 2 seconds.  Neurological:     Mental Status: She is alert and oriented to person, place, and time. Mental status is at baseline.  Psychiatric:        Mood and Affect: Mood normal.        Behavior: Behavior normal.     Results for orders placed or performed in visit on 11/07/18  Pathology  Result Value Ref Range   . Comment    . Comment    . Comment    . Comment    . Comment    . Comment    . Comment    . Comment        Pertinent labs & imaging results that were available during my care of the patient were reviewed by me and considered in my medical decision making.  Assessment & Plan:  There are no diagnoses linked to this encounter.   Continue all other maintenance medications.  Follow up plan: Return if symptoms worsen or fail to improve.  Continue healthy lifestyle choices, including diet (rich in fruits, vegetables, and lean proteins, and low in salt and simple carbohydrates) and exercise (at least 30 minutes of moderate physical activity daily).  Educational handout given for paronychia.  The above assessment and management plan was discussed with the patient. The patient verbalized understanding of and has agreed to the management plan. Patient is aware to call the clinic if they develop any new symptoms or if symptoms persist or  worsen. Patient is aware when to return to the clinic for a follow-up visit. Patient educated on when it is appropriate to go to the emergency department.   Monia Pouch, FNP-C [facility] [phone]

## 2019-07-19 NOTE — Patient Instructions (Signed)
Paronychia Paronychia is an infection of the skin that surrounds a nail. It usually affects the skin around a fingernail, but it may also occur near a toenail. It often causes pain and swelling around the nail. In some cases, a collection of pus (abscess) can form near or under the nail.  This condition may develop suddenly, or it may develop gradually over a longer period. In most cases, paronychia is not serious, and it will clear up with treatment. What are the causes? This condition may be caused by bacteria or a fungus. These germs can enter the body through an opening in the skin, such as a cut or a hangnail. What increases the risk? This condition is more likely to develop in people who:  Get their hands wet often, such as those who work as dishwashers, bartenders, or nurses.  Bite their fingernails or suck their thumbs.  Trim their nails very short.  Have hangnails or injured fingertips.  Get manicures.  Have diabetes. What are the signs or symptoms? Symptoms of this condition include:  Redness and swelling of the skin near the nail.  Tenderness around the nail when you touch the area.  Pus-filled bumps under the skin at the base and sides of the nail (cuticle).  Fluid or pus under the nail.  Throbbing pain in the area. How is this diagnosed? This condition is diagnosed with a physical exam. In some cases, a sample of pus may be tested to determine what type of bacteria or fungus is causing the condition. How is this treated? Treatment depends on the cause and severity of your condition. If your condition is mild, it may clear up on its own in a few days or after soaking in warm water. If needed, treatment may include:  Antibiotic medicine, if your infection is caused by bacteria.  Antifungal medicine, if your infection is caused by a fungus.  A procedure to drain pus from an abscess.  Anti-inflammatory medicine (corticosteroids). Follow these instructions at  home: Wound care  Keep the affected area clean.  Soak the affected area in warm water, if told to do so by your health care provider. You may be told to do this for 20 minutes, 2-3 times a day.  Keep the area dry when you are not soaking it.  Do not try to drain an abscess yourself.  Follow instructions from your health care provider about how to take care of the affected area. Make sure you: ? Wash your hands with soap and water before you change your bandage (dressing). If soap and water are not available, use hand sanitizer. ? Change your dressing as told by your health care provider.  If you had an abscess drained, check the area every day for signs of infection. Check for: ? Redness, swelling, or pain. ? Fluid or blood. ? Warmth. ? Pus or a bad smell. Medicines   Take over-the-counter and prescription medicines only as told by your health care provider.  If you were prescribed an antibiotic medicine, take it as told by your health care provider. Do not stop taking the antibiotic even if you start to feel better. General instructions  Avoid contact with harsh chemicals.  Do not pick at the affected area. Prevention  To prevent this condition from happening again: ? Wear rubber gloves when washing dishes or doing other tasks that require your hands to get wet. ? Wear gloves if your hands might come in contact with cleaners or other chemicals. ? Avoid   injuring your nails or fingertips. ? Do not bite your nails or tear hangnails. ? Do not cut your nails very short. ? Do not cut your cuticles. ? Use clean nail clippers or scissors when trimming nails. Contact a health care provider if:  Your symptoms get worse or do not improve with treatment.  You have continued or increased fluid, blood, or pus coming from the affected area.  Your finger or knuckle becomes swollen or difficult to move. Get help right away if you have:  A fever or chills.  Redness spreading away from  the affected area.  Joint or muscle pain. Summary  Paronychia is an infection of the skin that surrounds a nail. It often causes pain and swelling around the nail. In some cases, a collection of pus (abscess) can form near or under the nail.  This condition may be caused by bacteria or a fungus. These germs can enter the body through an opening in the skin, such as a cut or a hangnail.  If your condition is mild, it may clear up on its own in a few days. If needed, treatment may include medicine or a procedure to drain pus from an abscess.  To prevent this condition from happening again, wear gloves if doing tasks that require your hands to get wet or to come in contact with chemicals. Also avoid injuring your nails or fingertips. This information is not intended to replace advice given to you by your health care provider. Make sure you discuss any questions you have with your health care provider. Document Released: 04/06/2001 Document Revised: 10/28/2017 Document Reviewed: 10/24/2017 Elsevier Patient Education  2020 [employer]  

## 2019-07-19 NOTE — Progress Notes (Signed)
Subjective:  Patient ID: Maria Camacho, female DOB: [birth year], 85 y.o. MRN: [id number]   Patient Care Team:  Dettinger, Fransisca Kaufmann, MD as PCP - General (Family Medicine)  Kerry Kass, MD as Referring Physician (Surgery)  Christy Sartorius, MD as Referring Physician (Urology)  Marygrace Drought, MD as Consulting Physician (Ophthalmology)  Steffanie Rainwater, DPM as Consulting Physician (Podiatry)   Chief Complaint: finger infection (left middle )   HPI:  Maria Camacho is a 85 y.o. female presenting on 07/19/2019 for finger infection (left middle)   Maria Camacho presents with pain of the 3rd digit of her left hand x1 month. The pain is located to both sides of the top of the nail. She reports the finger was infected 3 months ago and she was treated with an antibiotic. She reports some relief after lifting the corner of her nail on the left side. Denies fever, drainage, or spreading erythema.   Hand Pain  There was no injury mechanism. The pain does not radiate. The pain is at a severity of 4/10. The pain is moderate. The pain has been intermittent since the incident. Pertinent negatives include no chest pain, muscle weakness, numbness or tingling. The symptoms are aggravated by palpation. Treatments tried: bandage, cortisone cream. The treatment provided mild relief.   Relevant past medical, surgical, family, and social history reviewed and updated as indicated.   Allergies and medications reviewed and updated. Date reviewed: Chart in Epic.      Past Medical History:  Diagnosis Date  . Allergy    seasonal   . Cancer ([place])    kidney right  . Cataract    see opth note from 09/2013  . Glaucoma 09/2013   open angle, low risk  . Hyperlipidemia   . Hypertension   . Osteopenia    dexa 09/2013  . Vertigo 2011        Past Surgical History:  Procedure Laterality Date  . ABDOMINAL HYSTERECTOMY  1979  . ACNE CYST REMOVAL  over 20 yrs. ago   Fatty tiisue of neck  . KNEE ARTHROSCOPY  08/10/2012    Procedure: ARTHROSCOPY KNEE; Surgeon: Johnn Hai, MD; Location: [facility]; Service: Orthopedics; Laterality: Left; WITH DEBRIDEMENT  . NM PET DX LYMPHOMA    . right kidney cancer surgery  2006   Social History        Socioeconomic History  . Marital status: Married    Spouse name: Not on file  . Number of children: 3  . Years of education: Not on file  . Highest education level: Not on file  Occupational History  . Occupation: Retired    Comment: Tour manager  . Financial resource strain: Not hard at all  . Food insecurity    Worry: Never true    Inability: Never true  . Transportation needs    Medical: No    Non-medical: No  Tobacco Use  . Smoking status: Never Smoker  . Smokeless tobacco: Never Used  Substance and Sexual Activity  . Alcohol use: No  . Drug use: No  . Sexual activity: Not on file  Lifestyle  . Physical activity    Days per week: 3 days    Minutes per session: 30 min  . Stress: Only a little  Relationships  . Social connections    Talks on phone: More than three times a week    Gets together: More than three times a week    Attends religious service: More  than 4 times per year    Active member of club or organization: Yes    Attends meetings of clubs or organizations: More than 4 times per year    Relationship status: Married  . Intimate partner violence    Fear of current or ex partner: No    Emotionally abused: No    Physically abused: No    Forced sexual activity: No  Other Topics Concern  . Not on file  Social History Narrative   Lives at home with husband - married almost 62 years (2019)       Outpatient Encounter Medications as of 07/19/2019  Medication Sig  . acetaminophen (TYLENOL) 500 MG tablet Take 500 mg by mouth as needed. Reported on 12/29/2015  . amLODipine-valsartan (EXFORGE) 10-320 MG tablet Take 0.5 tablets by mouth daily.  . B Complex-C-E-Zn (BEC/ZINC) TABS Take 1 tablet by mouth daily.   . bimatoprost (LUMIGAN)  0.01 % SOLN Place 1 drop into both eyes at bedtime.   . chlorpheniramine (ALLERGY) 4 MG tablet Take 4 mg by mouth daily as needed for allergies.  . Cholecalciferol (VITAMIN D) 2000 UNITS tablet Take 2,000 Units by mouth daily. Take 2000IU daily M-F and 4000IU Sat and Sun  . conjugated estrogens (PREMARIN) vaginal cream Place 0.5 Applicatorfuls vaginally daily as needed. Reported on 03/01/2016  . dorzolamide-timolol (COSOPT) 22.3-6.8 MG/ML ophthalmic solution Place 1 drop into both eyes daily.   . fish oil-omega-3 fatty acids 1000 MG capsule Take 1 g by mouth daily.  . NON FORMULARY Probiotic with cranberry  . triamcinolone cream (KENALOG) 0.1 % Apply 1 application topically 2 (two) times daily.  . [DISCONTINUED] sulfamethoxazole-trimethoprim (BACTRIM DS) 800-160 MG tablet Take 1 tablet by mouth 2 (two) times daily. (Patient not taking: Reported on 05/28/2019)  . [DISCONTINUED] lidocaine (XYLOCAINE) 2 % (with pres) injection 60 mg    No facility-administered encounter medications on file as of 07/19/2019.         Allergies  Allergen Reactions  . Actonel [Risedronate Sodium] Other (See Comments)    dizziness  . Bacitracin Rash  . Livalo [Pitavastatin] Nausea Only  . Oxytrol [Oxybutynin] Rash  . Penicillins     REACTION: hives   Review of Systems  Constitutional: Negative for chills, fever and unexpected weight change.  Respiratory: Negative for shortness of breath.  Cardiovascular: Negative for chest pain.  Skin: Negative for rash.  Neurological: Negative for tingling and numbness.   Objective:  BP 133/73  Pulse 67  Temp 97.8 F (36.6 C)  Resp 20  Ht 4\' 10"  (1.473 m)  Wt 148 lb (67.1 kg)  SpO2 98%  BMI 30.93 kg/m     Wt Readings from Last 3 Encounters:  07/19/19 148 lb (67.1 kg)  05/21/19 149 lb (67.6 kg)  05/18/19 149 lb 6.4 oz (67.8 kg)   Physical Exam  Constitutional:  General: She is not in acute distress. Appearance: Normal appearance. She is not toxic-appearing.   Cardiovascular:  Rate and Rhythm: Normal rate and regular rhythm.  Heart sounds: Normal heart sounds.  Pulmonary:  Effort: Pulmonary effort is normal. No respiratory distress.  Breath sounds: Normal breath sounds.  Musculoskeletal:  Comments: Left hand: Localized erythema, mild swelling, and tenderness to skin along top of nail bilaterally of 3rd digit. No tenderness or erythema to palpation of finger pad. No drainage noted. Full ROM and strength of left hand. Sensation intact.  Skin:  General: Skin is warm and dry.  Capillary Refill: Capillary refill takes less than  2 seconds.  Neurological:  Mental Status: She is alert and oriented to person, place, and time. Mental status is at baseline.  Psychiatric:  Mood and Affect: Mood normal.  Behavior: Behavior normal.   Pertinent labs & imaging results that were available during my care of the patient were reviewed by me and considered in my medical decision making.   Attempted to wedge fingernail in office with cotton after soaking in warm water for several minutes. Unable to wedge due to discomfort.   Assessment & Plan:  Maria Camacho was seen today for finger infection.  Diagnoses and all orders for this visit:  Paronychia of left middle finger Attempted to wedge fingernail in office after soaking for several minutes. Unable to wedge nail. Wound care discussed in detail. Pt aware to soak several times per day and to pull skin away from nail and to apply Kenalog cream as prescribed. Keflex as prescribed. Report any new, worsening, or persistent symptoms. Nail care discussed.  -     cephALEXin (KEFLEX) 500 MG capsule; Take 1 capsule (500 mg total) by mouth 2 (two) times daily for 7 days. -     triamcinolone cream (KENALOG) 0.1 %; Apply 1 application topically 2 (two) times daily.   Educational handout given for paronychia.   The above assessment and management plan was discussed with the patient. The patient verbalized understanding of and has  agreed to the management plan. Patient is aware to call the clinic if they develop any new symptoms or if symptoms persist or worsen. Patient is aware when to return to the clinic for a follow-up visit. Patient educated on when it is appropriate to go to the emergency department.    Monia Pouch, FNP-C  [facility]  [phone]

## 2019-07-19 NOTE — Progress Notes (Deleted)
Subjective:  Patient ID: Maria Camacho, female DOB: [birth year], 85 y.o. MRN: [id number]  Patient Care Team:  Dettinger, Fransisca Kaufmann, MD as PCP - General (Family Medicine)  Kerry Kass, MD as Referring Physician (Surgery)  Christy Sartorius, MD as Referring Physician (Urology)  Marygrace Drought, MD as Consulting Physician (Ophthalmology)  Steffanie Rainwater, DPM as Consulting Physician (Podiatry)   Chief Complaint: finger infection (left middle )   HPI:  Maria Camacho is a 85 y.o. female presenting on 07/19/2019 for finger infection (left middle )  Maria Camacho presents with pain of the 3rd digit of her left hand x1 month. The pain is located to both sides of the top of the nail. She reports the finger was infected 3 months ago and she was treated with an antibiotic. She reports some relief after lifting the corner of her nail on the left side. Denies fever, drainage, or spreading erythema.  Hand Pain  There was no injury mechanism. The pain does not radiate. The pain is at a severity of 4/10. The pain is moderate. The pain has been intermittent since the incident. Pertinent negatives include no chest pain, muscle weakness, numbness or tingling. The symptoms are aggravated by palpation. Treatments tried: bandage, cortisone cream. The treatment provided mild relief.   No diagnosis found.  Relevant past medical, surgical, family, and social history reviewed and updated as indicated.  Allergies and medications reviewed and updated. Date reviewed: Chart in Epic.      Past Medical History:  Diagnosis Date  . Allergy    seasonal   . Cancer ([place])    kidney right  . Cataract    see opth note from 09/2013  . Glaucoma 09/2013   open angle, low risk  . Hyperlipidemia   . Hypertension   . Osteopenia    dexa 09/2013  . Vertigo 2011        Past Surgical History:  Procedure Laterality Date  . ABDOMINAL HYSTERECTOMY  1979  . ACNE CYST REMOVAL  over 20 yrs. ago   Fatty tiisue of neck  . KNEE  ARTHROSCOPY  08/10/2012   Procedure: ARTHROSCOPY KNEE; Surgeon: Johnn Hai, MD; Location: [facility]; Service: Orthopedics; Laterality: Left; WITH DEBRIDEMENT  . NM PET DX LYMPHOMA    . right kidney cancer surgery  2006   Social History        Socioeconomic History  . Marital status: Married    Spouse name: Not on file  . Number of children: 3  . Years of education: Not on file  . Highest education level: Not on file  Occupational History  . Occupation: Retired    Comment: Tour manager  . Financial resource strain: Not hard at all  . Food insecurity    Worry: Never true    Inability: Never true  . Transportation needs    Medical: No    Non-medical: No  Tobacco Use  . Smoking status: Never Smoker  . Smokeless tobacco: Never Used  Substance and Sexual Activity  . Alcohol use: No  . Drug use: No  . Sexual activity: Not on file  Lifestyle  . Physical activity    Days per week: 3 days    Minutes per session: 30 min  . Stress: Only a little  Relationships  . Social connections    Talks on phone: More than three times a week    Gets together: More than three times a week    Attends  religious service: More than 4 times per year    Active member of club or organization: Yes    Attends meetings of clubs or organizations: More than 4 times per year    Relationship status: Married  . Intimate partner violence    Fear of current or ex partner: No    Emotionally abused: No    Physically abused: No    Forced sexual activity: No  Other Topics Concern  . Not on file  Social History Narrative   Lives at home with husband - married almost 25 years (2019)       Outpatient Encounter Medications as of 07/19/2019  Medication Sig  . acetaminophen (TYLENOL) 500 MG tablet Take 500 mg by mouth as needed. Reported on 12/29/2015  . amLODipine-valsartan (EXFORGE) 10-320 MG tablet Take 0.5 tablets by mouth daily.  . B Complex-C-E-Zn (BEC/ZINC) TABS Take 1 tablet by mouth daily.    . bimatoprost (LUMIGAN) 0.01 % SOLN Place 1 drop into both eyes at bedtime.   . chlorpheniramine (ALLERGY) 4 MG tablet Take 4 mg by mouth daily as needed for allergies.  . Cholecalciferol (VITAMIN D) 2000 UNITS tablet Take 2,000 Units by mouth daily. Take 2000IU daily M-F and 4000IU Sat and Sun  . conjugated estrogens (PREMARIN) vaginal cream Place 0.5 Applicatorfuls vaginally daily as needed. Reported on 03/01/2016  . dorzolamide-timolol (COSOPT) 22.3-6.8 MG/ML ophthalmic solution Place 1 drop into both eyes daily.   . fish oil-omega-3 fatty acids 1000 MG capsule Take 1 g by mouth daily.  . NON FORMULARY Probiotic with cranberry  . triamcinolone cream (KENALOG) 0.1 % Apply 1 application topically 2 (two) times daily.  . [DISCONTINUED] sulfamethoxazole-trimethoprim (BACTRIM DS) 800-160 MG tablet Take 1 tablet by mouth 2 (two) times daily. (Patient not taking: Reported on 05/28/2019)  . [DISCONTINUED] lidocaine (XYLOCAINE) 2 % (with pres) injection 60 mg    No facility-administered encounter medications on file as of 07/19/2019.         Allergies  Allergen Reactions  . Actonel [Risedronate Sodium] Other (See Comments)    dizziness  . Bacitracin Rash  . Livalo [Pitavastatin] Nausea Only  . Oxytrol [Oxybutynin] Rash  . Penicillins     REACTION: hives  Review of Systems  Constitutional: Negative for chills, fever and unexpected weight change.  Respiratory: Negative for shortness of breath.  Cardiovascular: Negative for chest pain.  Skin: Negative for rash.  Neurological: Negative for tingling and numbness.   Objective:  BP 133/73  Pulse 67  Temp 97.8 F (36.6 C)  Resp 20  Ht 4\' 10"  (1.473 m)  Wt 148 lb (67.1 kg)  SpO2 98%  BMI 30.93 kg/m     Wt Readings from Last 3 Encounters:  07/19/19 148 lb (67.1 kg)  05/21/19 149 lb (67.6 kg)  05/18/19 149 lb 6.4 oz (67.8 kg)  Physical Exam  Constitutional:  General: She is not in acute distress. Appearance: Normal appearance. She is not  toxic-appearing.  Cardiovascular:  Rate and Rhythm: Normal rate and regular rhythm.  Heart sounds: Normal heart sounds.  Pulmonary:  Effort: Pulmonary effort is normal. No respiratory distress.  Breath sounds: Normal breath sounds.  Musculoskeletal:  Comments: Left hand: Localized erythema, mild swelling, and tenderness to skin along top of nail bilaterally of 3rd digit. No tenderness or erythema to palpation of finger pad. No drainage noted. Full ROM and strength of left hand. Sensation intact.  Skin:  General: Skin is warm and dry.  Capillary Refill: Capillary refill takes less  than 2 seconds.  Neurological:  Mental Status: She is alert and oriented to person, place, and time. Mental status is at baseline.  Psychiatric:  Mood and Affect: Mood normal.  Behavior: Behavior normal.        Results for orders placed or performed in visit on 11/07/18  Pathology  Result Value Ref Range   . Comment    . Comment    . Comment    . Comment    . Comment    . Comment    . Comment    . Comment    Pertinent labs & imaging results that were available during my care of the patient were reviewed by me and considered in my medical decision making.   Assessment & Plan:  There are no diagnoses linked to this encounter.  Continue all other maintenance medications.   Follow up plan:  Return if symptoms worsen or fail to improve.  Continue healthy lifestyle choices, including diet (rich in fruits, vegetables, and lean proteins, and low in salt and simple carbohydrates) and exercise (at least 30 minutes of moderate physical activity daily).  Educational handout given for paronychia.  The above assessment and management plan was discussed with the patient. The patient verbalized understanding of and has agreed to the management plan. Patient is aware to call the clinic if they develop any new symptoms or if symptoms persist or worsen. Patient is aware when to return to the clinic for a follow-up  visit. Patient educated on when it is appropriate to go to the emergency department.  Monia Pouch, FNP-C  [facility]  [phone]

## 2019-08-02 HISTORY — PX: EYE SURGERY: SHX253

## 2019-08-21 ENCOUNTER — Ambulatory Visit: Payer: Medicare Other | Admitting: Family Medicine

## 2019-08-22 ENCOUNTER — Other Ambulatory Visit: Payer: Self-pay

## 2019-08-22 ENCOUNTER — Encounter: Payer: Self-pay | Admitting: Family Medicine

## 2019-08-22 ENCOUNTER — Ambulatory Visit: Payer: Medicare Other | Admitting: Family Medicine

## 2019-08-22 VITALS — BP 148/68 | HR 65 | Temp 96.8°F | Resp 20 | Ht <= 58 in | Wt 149.0 lb

## 2019-08-22 DIAGNOSIS — E782 Mixed hyperlipidemia: Secondary | ICD-10-CM | POA: Diagnosis not present

## 2019-08-22 DIAGNOSIS — D229 Melanocytic nevi, unspecified: Secondary | ICD-10-CM

## 2019-08-22 DIAGNOSIS — N8110 Cystocele, unspecified: Secondary | ICD-10-CM

## 2019-08-22 DIAGNOSIS — E559 Vitamin D deficiency, unspecified: Secondary | ICD-10-CM | POA: Diagnosis not present

## 2019-08-22 DIAGNOSIS — Z6831 Body mass index (BMI) 31.0-31.9, adult: Secondary | ICD-10-CM | POA: Insufficient documentation

## 2019-08-22 DIAGNOSIS — I10 Essential (primary) hypertension: Secondary | ICD-10-CM

## 2019-08-22 MED ORDER — AMLODIPINE BESYLATE-VALSARTAN 10-320 MG PO TABS: 0.5000 | ORAL_TABLET | Freq: Every day | ORAL | 3 refills | Status: AC

## 2019-08-22 NOTE — Progress Notes (Signed)
Subjective:  Patient ID: Maria Camacho, female    DOB: November 30, 1932, 85 y.o.   MRN: [id number]  Patient Care Team: Dettinger, Fransisca Kaufmann, MD as PCP - General (Family Medicine) Kerry Kass, MD as Referring Physician (Surgery) Christy Sartorius, MD as Referring Physician (Urology) Marygrace Drought, MD as Consulting Physician (Ophthalmology) Steffanie Rainwater, DPM as Consulting Physician (Podiatry)   Chief Complaint:  Medical Management of Chronic Issues and Hypertension   HPI: Maria Camacho is a 85 y.o. female presenting on 08/22/2019 for Medical Management of Chronic Issues and Hypertension  1. Essential hypertension Well controlled at home. No chest pain, shortness of breath, headaches, dizziness, leg swelling, or confusion. Compliant with her medications and watches diet. Is active daily.   2. Mixed hyperlipidemia Does try to watch what she eats. Does not eat fried foods. Active daily.  3. Vitamin D deficiency On repletion therapy. No muscle or bone pain, no muscle weakness.   4. Bladder prolapse, female, acquired Was seeing GYN. Has been taught to manipulate bladder if needed. Does have OAB symptoms at times.   5. BMI 31.0-31.9,adult Does try to watch diet and stay active.   6. Atypical mole Pt reports several skin lesions to upper chest, neck, and arms. Has noticed growth in a few of the lesions. No pain or bleeding. Has not seen dermatology.      Relevant past medical, surgical, family, and social history reviewed and updated as indicated.  Allergies and medications reviewed and updated. Date reviewed: Chart in Epic.   Past Medical History:  Diagnosis Date  . Allergy    seasonal   . Cancer ([place])    kidney right  . Cataract    see opth note from 09/2013  . Glaucoma 09/2013   open angle, low risk  . Hyperlipidemia   . Hypertension   . Osteopenia    dexa 09/2013  . Vertigo 2011    Past Surgical History:  Procedure Laterality Date  . ABDOMINAL HYSTERECTOMY   1979  . ACNE CYST REMOVAL  over 20 yrs. ago   Fatty tiisue of neck  . EYE SURGERY Right 08/02/2019   cataract   . KNEE ARTHROSCOPY  08/10/2012   Procedure: ARTHROSCOPY KNEE;  Surgeon: Johnn Hai, MD;  Location: [facility];  Service: Orthopedics;  Laterality: Left;  WITH DEBRIDEMENT  . NM PET DX LYMPHOMA    . right kidney cancer surgery  2006    Social History   Socioeconomic History  . Marital status: Married    Spouse name: Not on file  . Number of children: 3  . Years of education: Not on file  . Highest education level: Not on file  Occupational History  . Occupation: Retired    Comment: Tour manager  . Financial resource strain: Not hard at all  . Food insecurity    Worry: Never true    Inability: Never true  . Transportation needs    Medical: No    Non-medical: No  Tobacco Use  . Smoking status: Never Smoker  . Smokeless tobacco: Never Used  Substance and Sexual Activity  . Alcohol use: No  . Drug use: No  . Sexual activity: Not on file  Lifestyle  . Physical activity    Days per week: 3 days    Minutes per session: 30 min  . Stress: Only a little  Relationships  . Social connections    Talks on phone: More than three times a  week    Gets together: More than three times a week    Attends religious service: More than 4 times per year    Active member of club or organization: Yes    Attends meetings of clubs or organizations: More than 4 times per year    Relationship status: Married  . Intimate partner violence    Fear of current or ex partner: No    Emotionally abused: No    Physically abused: No    Forced sexual activity: No  Other Topics Concern  . Not on file  Social History Narrative   Lives at home with husband - married almost 59 years  (2019)    Outpatient Encounter Medications as of 08/22/2019  Medication Sig  . acetaminophen (TYLENOL) 500 MG tablet Take 500 mg by mouth as needed. Reported on 12/29/2015  . amLODipine-valsartan  (EXFORGE) 10-320 MG tablet Take 0.5 tablets by mouth daily.  . B Complex-C-E-Zn (BEC/ZINC) TABS Take 1 tablet by mouth daily.   . bimatoprost (LUMIGAN) 0.01 % SOLN Place 1 drop into both eyes at bedtime.   . chlorpheniramine (ALLERGY) 4 MG tablet Take 4 mg by mouth daily as needed for allergies.  . Cholecalciferol (VITAMIN D) 2000 UNITS tablet Take 2,000 Units by mouth daily. Take 2000IU daily M-F and 4000IU Sat and Sun  . conjugated estrogens (PREMARIN) vaginal cream Place 0.5 Applicatorfuls vaginally daily as needed. Reported on 03/01/2016  . dorzolamide-timolol (COSOPT) 22.3-6.8 MG/ML ophthalmic solution Place 1 drop into both eyes daily.   . fish oil-omega-3 fatty acids 1000 MG capsule Take 1 g by mouth daily.  . naproxen sodium (ALEVE) 220 MG tablet Take 220 mg by mouth daily as needed.  . NON FORMULARY Probiotic with cranberry  . triamcinolone cream (KENALOG) 0.1 % Apply 1 application topically 2 (two) times daily.  . [DISCONTINUED] amLODipine-valsartan (EXFORGE) 10-320 MG tablet Take 0.5 tablets by mouth daily.   No facility-administered encounter medications on file as of 08/22/2019.     Allergies  Allergen Reactions  . Actonel [Risedronate Sodium] Other (See Comments)    dizziness  . Bacitracin Rash  . Livalo [Pitavastatin] Nausea Only  . Oxytrol [Oxybutynin] Rash  . Penicillins     REACTION: hives    Review of Systems  Constitutional: Negative for activity change, appetite change, chills, diaphoresis, fatigue, fever and unexpected weight change.  HENT: Negative.   Eyes: Negative.  Negative for photophobia and visual disturbance.  Respiratory: Negative for cough, chest tightness and shortness of breath.   Cardiovascular: Negative for chest pain, palpitations and leg swelling.  Gastrointestinal: Negative for abdominal distention, abdominal pain, anal bleeding, blood in stool, constipation, diarrhea, nausea, rectal pain and vomiting.  Endocrine: Negative.  Negative for cold  intolerance, heat intolerance, polydipsia, polyphagia and polyuria.  Genitourinary: Positive for urgency. Negative for decreased urine volume, difficulty urinating, dyspareunia, dysuria, enuresis, flank pain, frequency, hematuria, pelvic pain, vaginal bleeding, vaginal discharge and vaginal pain.  Musculoskeletal: Negative for arthralgias and myalgias.  Skin: Positive for color change.  Allergic/Immunologic: Negative.   Neurological: Negative for dizziness, tremors, seizures, syncope, facial asymmetry, speech difficulty, weakness, light-headedness, numbness and headaches.  Hematological: Negative.  Does not bruise/bleed easily.  Psychiatric/Behavioral: Negative for confusion, hallucinations, sleep disturbance and suicidal ideas.  All other systems reviewed and are negative.       Objective:  BP (!) 148/68   Pulse 65   Temp (!) 96.8 F (36 C)   Resp 20   Ht 4' 10"  (1.473 m)  Wt 149 lb (67.6 kg)   SpO2 97%   BMI 31.14 kg/m    Wt Readings from Last 3 Encounters:  08/22/19 149 lb (67.6 kg)  07/19/19 148 lb (67.1 kg)  05/21/19 149 lb (67.6 kg)    Physical Exam Vitals signs and nursing note reviewed.  Constitutional:      General: She is not in acute distress.    Appearance: Normal appearance. She is well-developed and well-groomed. She is obese. She is not ill-appearing, toxic-appearing or diaphoretic.  HENT:     Head: Normocephalic and atraumatic.     Jaw: There is normal jaw occlusion.     Right Ear: Hearing normal.     Left Ear: Hearing normal.     Nose: Nose normal.     Mouth/Throat:     Lips: Pink.     Mouth: Mucous membranes are moist.     Pharynx: Oropharynx is clear. Uvula midline.  Eyes:     General: Lids are normal.     Extraocular Movements: Extraocular movements intact.     Conjunctiva/sclera: Conjunctivae normal.     Pupils: Pupils are equal, round, and reactive to light.  Neck:     Musculoskeletal: Normal range of motion and neck supple.     Thyroid: No  thyroid mass, thyromegaly or thyroid tenderness.     Vascular: No carotid bruit or JVD.     Trachea: Trachea and phonation normal.  Cardiovascular:     Rate and Rhythm: Normal rate and regular rhythm.     Chest Wall: PMI is not displaced.     Pulses: Normal pulses.     Heart sounds: Normal heart sounds. No murmur. No friction rub. No gallop.   Pulmonary:     Effort: Pulmonary effort is normal. No respiratory distress.     Breath sounds: Normal breath sounds. No wheezing.  Abdominal:     General: Bowel sounds are normal. There is no distension or abdominal bruit.     Palpations: Abdomen is soft. There is no hepatomegaly or splenomegaly.     Tenderness: There is no abdominal tenderness. There is no right CVA tenderness or left CVA tenderness.     Hernia: No hernia is present.  Musculoskeletal: Normal range of motion.     Right lower leg: No edema.     Left lower leg: No edema.  Lymphadenopathy:     Cervical: No cervical adenopathy.  Skin:    General: Skin is warm and dry.     Capillary Refill: Capillary refill takes less than 2 seconds.     Coloration: Skin is not cyanotic, jaundiced or pale.     Findings: Lesion (several atypical skin lesions to torso, neck, and arms.) present. No erythema or rash.       Neurological:     General: No focal deficit present.     Mental Status: She is alert and oriented to person, place, and time.     Cranial Nerves: Cranial nerves are intact. No cranial nerve deficit.     Sensory: Sensation is intact. No sensory deficit.     Motor: Motor function is intact. No weakness.     Coordination: Coordination is intact. Coordination normal.     Gait: Gait is intact. Gait normal.     Deep Tendon Reflexes: Reflexes are normal and symmetric. Reflexes normal.  Psychiatric:        Attention and Perception: Attention and perception normal.        Mood and Affect: Mood and affect  normal.        Speech: Speech normal.        Behavior: Behavior normal. Behavior  is cooperative.        Thought Content: Thought content normal.        Cognition and Memory: Cognition and memory normal.        Judgment: Judgment normal.     Results for orders placed or performed in visit on 11/07/18  Pathology  Result Value Ref Range   . Comment    . Comment    . Comment    . Comment    . Comment    . Comment    . Comment    . Comment        Pertinent labs & imaging results that were available during my care of the patient were reviewed by me and considered in my medical decision making.  Assessment & Plan:  Daron was seen today for medical management of chronic issues and hypertension.  Diagnoses and all orders for this visit:  Essential hypertension BP well controlled. Changes were not made in regimen today. Goal BP is 140/90. Pt aware to report any persistent high or low readings. DASH diet and exercise encouraged. Exercise at least 150 minutes per week and increase as tolerated. Goal BMI > 25. Stress management encouraged. Avoid nicotine and tobacco product use. Avoid excessive alcohol and NSAID's. Avoid more than 2000 mg of sodium daily. Medications as prescribed. Follow up as scheduled.  -     CMP14+EGFR -     CBC with Differential/Platelet -     Thyroid Panel With TSH -     Microalbumin / creatinine urine ratio -     amLODipine-valsartan (EXFORGE) 10-320 MG tablet; Take 0.5 tablets by mouth daily.  Mixed hyperlipidemia Diet encouraged - increase intake of fresh fruits and vegetables, increase intake of lean proteins. Bake, broil, or grill foods. Avoid fried, greasy, and fatty foods. Avoid fast foods. Increase intake of fiber-rich whole grains. Exercise encouraged - at least 150 minutes per week and advance as tolerated.  Goal BMI < 25. Follow up in 3-6 months as discussed.  -     Lipid panel  Vitamin D deficiency Labs pending. Continue repletion therapy. If indicated, will change repletion dosage. Eat foods rich in Vit D including milk, orange  juice, yogurt with vitamin D added, salmon or mackerel, canned tuna fish, cereals with vitamin D added, and cod liver oil. Get out in the sun but make sure to wear at least SPF 30 sunscreen.  -     CBC with Differential/Platelet -     VITAMIN D 25 Hydroxy (Vit-D Deficiency, Fractures)  Bladder prolapse, female, acquired  BMI 31.0-31.9,adult Diet and exercise encouraged. Labs pending.  -     CMP14+EGFR -     CBC with Differential/Platelet -     Lipid panel -     Thyroid Panel With TSH  Atypical mole Several atypical skin lesions, some with concerning features. Will refer to dermatology for evaluation and treatment.  -     Ambulatory referral to Dermatology     Continue all other maintenance medications.  Follow up plan: Return in about 3 months (around 11/22/2019), or if symptoms worsen or fail to improve.  Continue healthy lifestyle choices, including diet (rich in fruits, vegetables, and lean proteins, and low in salt and simple carbohydrates) and exercise (at least 30 minutes of moderate physical activity daily).  Educational handout given for healthy living  The  above assessment and management plan was discussed with the patient. The patient verbalized understanding of and has agreed to the management plan. Patient is aware to call the clinic if they develop any new symptoms or if symptoms persist or worsen. Patient is aware when to return to the clinic for a follow-up visit. Patient educated on when it is appropriate to go to the emergency department.   Monia Pouch, FNP-C [facility] [phone]

## 2019-08-22 NOTE — Patient Instructions (Signed)
The 4 Principles of Healthy Living  1. Do Not Smoke.  2. Maintain a BMI<30.  3. Exercise 150 minutes/week. (30 minutes 5 days a week of some form of aerobic exercise)  4. Eat 5 servings of fruits or vegetables daily.   Several studies have conclusively shown that individuals who do these things have a dramatic reduction in overall mortality, heart disease, diabetes, hypertension, stroke, congestive heart failure, and cancer. This means that without taking any pills, vitamins, tonics, etc - without spending a single penny - you can live a longer, healthier, more productive life. Let's look at these 4 principles separately.   1. Do Not Smoke - Make up your mind to quit, talk with your doctor to formulate a plan, and set a date.  2. Get to and maintain a BMI<30. This gets you out of the obese category. No longer being obese will dramatically reduce you and your family's risk of a multitude of health problems. It is not easy, but, it is possible. If you are extremely obese it may take years, but, each step you take will lead to dramatic rewards. With just 20 pounds of weight loss most people feel better, have less fatigue and joint pain, and feel more energetic. If you have health problems like diabetes, hypertension, or high cholesterol you might do away with your need for some medications. And most of all, while you change you lifestyle to attain this goal you will set a good example for all those around you, especially your children. Here are two proven steps to help you start losing weight. ? Portion Control - Eating your meals on a smaller plate and limiting the amount of calories you eat at each meal has been shown to lead to weight loss. The average plate is 10 inches in diameter. A 10 inch plate piled high with food can add up to 1500 calories (even more if you go back for seconds). The typical man needs 2000 calories per day total. Try using a smaller plate (8 inch paper plate or 7 inch saucer)  at each meal and NEVER go back for second helpings. ? Pedometer - individuals who wear a pedometer and try to walk 10,000 steps each day increase their physical activity, lose weight, and decrease their blood pressures.  3. Exercise 150 minutes/week. The overall health benefits of regular aerobic exercise are overwhelming:  Reduces the risk of dying prematurely. Reduces the risk of dying from heart disease.  Reduces the risk of stroke.  Reduces the risk of developing diabetes.  Reduces the risk of developing high blood pressure.  Helps reduce blood pressure in people who already have high blood pressure.  Reduces the risk of developing colon cancer.  Reduces feelings of depression and anxiety.  Helps control weight.  Helps build and maintain healthy bones, muscles and joints.  Helps older adults become stronger and better able to move about without falling.  Promotes psychological well-being.  If you have health problems or are over age 60 we recommend consulting your doctor before beginning. We recommend starting slow and working up. Start by reading the Healthnote on Starting an Exercise Program then get going. Do not over think the process. Pick something simple at home like walking with friends, using a treadmill, or riding an exercise bike. Experiment with different types of exercise until you find something you can tolerate (it does not have to be fun). Pick a 30 minute disc of inspirational music and listen to it while you exercise.   The more you do it the easier it will become and the better you will feel.  4. Eat 5 servings of fruits or vegetables daily.  A serving size is: One medium-size fruit  1/2 cup raw, cooked, frozen or canned fruits (in 100% juice) or vegetables  3/4 cup (6 oz.) 100% fruit or vegetable juice  1 cup raw, leafy vegetables  1/4 cup dried fruit   While this sounds easy enough actually getting this much fruits and vegetables takes some work and planning. You  will need to experiment with different types of fruits and vegetables to find ones you and your family can eat every day. Some simple tips include:  - Add fruit to your cereal each morning. - Eat a salad each day for lunch. The typical bowl of salad counts for 2 servings of vegetables.  - Have some fruits and vegetables at every meal. Use canned or frozen products if needed. - Eat fruits and vegetables for snacks - especially for the kids. - Replace the side of fries or chips with a cup of fruit, an apple, or a bowl of celery.  What are the benefits? Reduces heart disease and stroke. Possible reduction in cancer risk. Protects against the development of diabetes. Filling up on fat free fruits and vegetable decreases the amount of high fat foods you will eat, aiding in weight loss.  

## 2019-11-23 ENCOUNTER — Other Ambulatory Visit: Payer: Self-pay

## 2019-11-26 ENCOUNTER — Encounter: Payer: Self-pay | Admitting: Family Medicine

## 2019-11-26 ENCOUNTER — Ambulatory Visit: Payer: Medicare PPO | Admitting: Family Medicine

## 2019-11-26 ENCOUNTER — Other Ambulatory Visit: Payer: Self-pay

## 2019-11-26 VITALS — BP 133/74 | HR 72 | Temp 98.0°F | Ht <= 58 in | Wt 150.2 lb

## 2019-11-26 DIAGNOSIS — Z6831 Body mass index (BMI) 31.0-31.9, adult: Secondary | ICD-10-CM

## 2019-11-26 DIAGNOSIS — I10 Essential (primary) hypertension: Secondary | ICD-10-CM

## 2019-11-26 DIAGNOSIS — E782 Mixed hyperlipidemia: Secondary | ICD-10-CM | POA: Diagnosis not present

## 2019-11-26 DIAGNOSIS — E559 Vitamin D deficiency, unspecified: Secondary | ICD-10-CM

## 2019-11-26 NOTE — Progress Notes (Signed)
BP 133/74   Pulse 72   Temp 98 F (36.7 C) (Temporal)   Ht _0  (1.473 m)   Wt 150 lb 3.2 oz (68.1 kg)   SpO2 99%   BMI 31.39 kg/m    Subjective:   Patient ID: Maria Camacho, female    DOB: 1933-03-25, 86 y.o.   MRN: [id number]  HPI: KAMEE BOBST is a 86 y.o. female presenting on 11/26/2019 for Hypertension (3 month follow up)   HPI Hypertension Patient is currently on amlodipine valsartan, and their blood pressure today is 134/74. Patient denies any lightheadedness or dizziness. Patient denies headaches, blurred vision, chest pains, shortness of breath, or weakness. Denies any side effects from medication and is content with current medication.   Hyperlipidemia Patient is coming in for recheck of his hyperlipidemia. The patient is currently taking no medication currently and patient does deny wanting any statin but she does take fish oils. They deny any issues with myalgias or history of liver damage from it. They deny any focal numbness or weakness or chest pain.   Patient is coming in for vitamin D and recheck for that.  Patient's cholesterol and hypertension are more complicated by the patient's obesity.  Discussed weight loss and lifestyle modification and exercise with the patient.   Relevant past medical, surgical, family and social history reviewed and updated as indicated. Interim medical history since our last visit reviewed. Allergies and medications reviewed and updated.  Review of Systems  Constitutional: Negative for chills and fever.  Eyes: Negative for redness and visual disturbance.  Respiratory: Negative for chest tightness and shortness of breath.   Cardiovascular: Negative for chest pain and leg swelling.  Musculoskeletal: Negative for back pain and gait problem.  Skin: Negative for rash.  Neurological: Negative for dizziness, weakness, light-headedness, numbness and headaches.  Psychiatric/Behavioral: Negative for agitation and behavioral problems.    All other systems reviewed and are negative.   Per HPI unless specifically indicated above   Allergies as of 11/26/2019      Reactions   Actonel [risedronate Sodium] Other (See Comments)   dizziness   Bacitracin Rash   Livalo [pitavastatin] Nausea Only   Oxytrol [oxybutynin] Rash   Penicillins    REACTION: hives      Medication List       Accurate as of November 26, 2019 10:05 AM. If you have any questions, ask your nurse or doctor.        STOP taking these medications   Allergy 4 MG tablet Generic drug: chlorpheniramine Stopped by: Fransisca Kaufmann Ebonye Reade, MD   conjugated estrogens vaginal cream Commonly known as: Premarin Stopped by: Fransisca Kaufmann Sabrine Patchen, MD   triamcinolone cream 0.1 % Commonly known as: KENALOG Stopped by: Fransisca Kaufmann Jayshun Galentine, MD     TAKE these medications   acetaminophen 500 MG tablet Commonly known as: TYLENOL Take 500 mg by mouth as needed. Reported on 12/29/2015   amLODipine-valsartan 10-320 MG tablet Commonly known as: EXFORGE Take 0.5 tablets by mouth daily.   BEC/Zinc Tabs Take 1 tablet by mouth daily.   bimatoprost 0.01 % Soln Commonly known as: LUMIGAN Place 1 drop into both eyes at bedtime.   dorzolamide-timolol 22.3-6.8 MG/ML ophthalmic solution Commonly known as: COSOPT Place 1 drop into both eyes daily.   fish oil-omega-3 fatty acids 1000 MG capsule Take 1 g by mouth daily.   naproxen sodium 220 MG tablet Commonly known as: ALEVE Take 220 mg by mouth daily as needed.  NON FORMULARY Probiotic with cranberry   Vitamin D 50 MCG (2000 UT) tablet Take 2,000 Units by mouth daily. Take 2000IU daily M-F and 4000IU Sat and Sun        Objective:   BP 133/74   Pulse 72   Temp 98 F (36.7 C) (Temporal)   Ht _0  (1.473 m)   Wt 150 lb 3.2 oz (68.1 kg)   SpO2 99%   BMI 31.39 kg/m   Wt Readings from Last 3 Encounters:  11/26/19 150 lb 3.2 oz (68.1 kg)  08/22/19 149 lb (67.6 kg)  07/19/19 148 lb (67.1 kg)    Physical  Exam Vitals and nursing note reviewed.  Constitutional:      General: She is not in acute distress.    Appearance: She is well-developed. She is not diaphoretic.  Eyes:     Conjunctiva/sclera: Conjunctivae normal.  Cardiovascular:     Rate and Rhythm: Normal rate and regular rhythm.     Heart sounds: Normal heart sounds. No murmur.  Pulmonary:     Effort: Pulmonary effort is normal. No respiratory distress.     Breath sounds: Normal breath sounds. No wheezing.  Musculoskeletal:        General: No tenderness. Normal range of motion.  Skin:    General: Skin is warm and dry.     Findings: No rash.  Neurological:     Mental Status: She is alert and oriented to person, place, and time.     Coordination: Coordination normal.  Psychiatric:        Behavior: Behavior normal.       Assessment & Plan:   Problem List Items Addressed This Visit      Cardiovascular and Mediastinum   Hypertension - Primary   Relevant Orders   CBC with Differential/Platelet   CMP14+EGFR     Other   Hyperlipidemia   Relevant Orders   Lipid panel   Vitamin D deficiency   Relevant Orders   VITAMIN D 25 Hydroxy (Vit-D Deficiency, Fractures)   BMI 31.0-31.9,adult   Relevant Orders   CBC with Differential/Platelet      Current medication pressure looks good today as he is used to [employer]  and will go from there.  We will continue to monitor and check blood work today Follow up plan: Return in about 6 months (around 05/25/2020), or if symptoms worsen or fail to improve, for Hypertension and cholesterol.  Counseling provided for all of the vaccine components Orders Placed This Encounter  Procedures  . CBC with Differential/Platelet  . CMP14+EGFR  . Lipid panel  . VITAMIN D 25 Hydroxy (Vit-D Deficiency, Fractures)    Caryl Pina, MD [facility] 11/26/2019, 10:05 AM

## 2019-11-27 LAB — CMP14+EGFR
ALT: 14 IU/L (ref 0–32)
AST: 20 IU/L (ref 0–40)
Albumin/Globulin Ratio: 1.5 (ref 1.2–2.2)
Albumin: 4.4 g/dL (ref 3.6–4.6)
Alkaline Phosphatase: 76 IU/L (ref 39–117)
BUN/Creatinine Ratio: 12 (ref 12–28)
BUN: 11 mg/dL (ref 8–27)
Bilirubin Total: 0.3 mg/dL (ref 0.0–1.2)
CO2: 25 mmol/L (ref 20–29)
Calcium: 9.4 mg/dL (ref 8.7–10.3)
Chloride: 106 mmol/L (ref 96–106)
Creatinine, Ser: 0.9 mg/dL (ref 0.57–1.00)
GFR calc Af Amer: 68 mL/min/{1.73_m2} (ref >59)
GFR calc non Af Amer: 59 mL/min/{1.73_m2} — ABNORMAL LOW (ref >59)
Globulin, Total: 2.9 g/dL (ref 1.5–4.5)
Glucose: 97 mg/dL (ref 65–99)
Potassium: 5 mmol/L (ref 3.5–5.2)
Sodium: 142 mmol/L (ref 134–144)
Total Protein: 7.3 g/dL (ref 6.0–8.5)

## 2019-11-27 LAB — CBC WITH DIFFERENTIAL/PLATELET
Basophils Absolute: 0.1 10*3/uL (ref 0.0–0.2)
Basos: 1 %
EOS (ABSOLUTE): 0.2 10*3/uL (ref 0.0–0.4)
Eos: 3 %
Hematocrit: 44 % (ref 34.0–46.6)
Hemoglobin: 14.7 g/dL (ref 11.1–15.9)
Immature Grans (Abs): 0 10*3/uL (ref 0.0–0.1)
Immature Granulocytes: 0 %
Lymphocytes Absolute: 2.8 10*3/uL (ref 0.7–3.1)
Lymphs: 31 %
MCH: 30.4 pg (ref 26.6–33.0)
MCHC: 33.4 g/dL (ref 31.5–35.7)
MCV: 91 fL (ref 79–97)
Monocytes Absolute: 0.8 10*3/uL (ref 0.1–0.9)
Monocytes: 9 %
Neutrophils Absolute: 5 10*3/uL (ref 1.4–7.0)
Neutrophils: 56 %
Platelets: 246 10*3/uL (ref 150–450)
RBC: 4.83 x10E6/uL (ref 3.77–5.28)
RDW: 12 % (ref 11.7–15.4)
WBC: 8.9 10*3/uL (ref 3.4–10.8)

## 2019-11-27 LAB — LIPID PANEL
Chol/HDL Ratio: 2.7 ratio (ref 0.0–4.4)
Cholesterol, Total: 211 mg/dL — ABNORMAL HIGH (ref 100–199)
HDL: 77 mg/dL (ref >39)
LDL Chol Calc (NIH): 112 mg/dL — ABNORMAL HIGH (ref 0–99)
Triglycerides: 129 mg/dL (ref 0–149)
VLDL Cholesterol Cal: 22 mg/dL (ref 5–40)

## 2019-11-27 LAB — VITAMIN D 25 HYDROXY (VIT D DEFICIENCY, FRACTURES): Vit D, 25-Hydroxy: 43.1 ng/mL (ref 30.0–100.0)

## 2019-11-28 ENCOUNTER — Encounter: Payer: Self-pay | Admitting: Family Medicine

## 2020-02-05 DIAGNOSIS — S52502A Unspecified fracture of the lower end of left radius, initial encounter for closed fracture: Secondary | ICD-10-CM | POA: Diagnosis not present

## 2020-02-05 DIAGNOSIS — S52612A Displaced fracture of left ulna styloid process, initial encounter for closed fracture: Secondary | ICD-10-CM | POA: Diagnosis not present

## 2020-02-05 DIAGNOSIS — S52602A Unspecified fracture of lower end of left ulna, initial encounter for closed fracture: Secondary | ICD-10-CM | POA: Diagnosis not present

## 2020-02-07 DIAGNOSIS — S52602A Unspecified fracture of lower end of left ulna, initial encounter for closed fracture: Secondary | ICD-10-CM | POA: Diagnosis not present

## 2020-02-07 DIAGNOSIS — S52502A Unspecified fracture of the lower end of left radius, initial encounter for closed fracture: Secondary | ICD-10-CM | POA: Diagnosis not present

## 2020-02-08 DIAGNOSIS — Z01818 Encounter for other preprocedural examination: Secondary | ICD-10-CM | POA: Diagnosis not present

## 2020-02-12 ENCOUNTER — Emergency Department (HOSPITAL_COMMUNITY)
Admission: EM | Admit: 2020-02-12 | Discharge: 2020-02-12 | Discharge status: Absent without leave | Payer: Medicare Other

## 2020-02-12 DIAGNOSIS — Z85528 Personal history of other malignant neoplasm of kidney: Secondary | ICD-10-CM | POA: Diagnosis not present

## 2020-02-12 DIAGNOSIS — I10 Essential (primary) hypertension: Secondary | ICD-10-CM | POA: Diagnosis not present

## 2020-02-12 DIAGNOSIS — R404 Transient alteration of awareness: Secondary | ICD-10-CM | POA: Diagnosis not present

## 2020-02-12 DIAGNOSIS — S52592A Other fractures of lower end of left radius, initial encounter for closed fracture: Secondary | ICD-10-CM | POA: Diagnosis not present

## 2020-02-12 DIAGNOSIS — Z79899 Other long term (current) drug therapy: Secondary | ICD-10-CM | POA: Diagnosis not present

## 2020-02-12 DIAGNOSIS — Z880 Allergy status to penicillin: Secondary | ICD-10-CM | POA: Diagnosis not present

## 2020-02-12 DIAGNOSIS — M8588 Other specified disorders of bone density and structure, other site: Secondary | ICD-10-CM | POA: Diagnosis not present

## 2020-02-12 DIAGNOSIS — S52692A Other fracture of lower end of left ulna, initial encounter for closed fracture: Secondary | ICD-10-CM | POA: Diagnosis not present

## 2020-02-12 DIAGNOSIS — Z743 Need for continuous supervision: Secondary | ICD-10-CM | POA: Diagnosis not present

## 2020-02-12 DIAGNOSIS — S62102A Fracture of unspecified carpal bone, left wrist, initial encounter for closed fracture: Secondary | ICD-10-CM | POA: Diagnosis not present

## 2020-02-12 DIAGNOSIS — Z888 Allergy status to other drugs, medicaments and biological substances status: Secondary | ICD-10-CM | POA: Diagnosis not present

## 2020-02-12 DIAGNOSIS — E785 Hyperlipidemia, unspecified: Secondary | ICD-10-CM | POA: Diagnosis not present

## 2020-02-23 DIAGNOSIS — 419620001 Death: Secondary | SNOMED CT | POA: Diagnosis not present

## 2020-02-23 DEATH — deceased

## 2020-05-26 ENCOUNTER — Ambulatory Visit: Payer: Medicare PPO | Admitting: Family Medicine
